# Patient Record
Sex: Female | Born: 1995 | Race: Black or African American | Hispanic: No | Marital: Single | Smoking: Never smoker
Health system: Southern US, Community
[De-identification: ages and names within clinical notes are randomized; demographics above are authoritative.]

## PROBLEM LIST (undated history)

## (undated) ENCOUNTER — Emergency Department (HOSPITAL_BASED_OUTPATIENT_CLINIC_OR_DEPARTMENT_OTHER): Admission: EM | Payer: Medicaid Other | Source: Home / Self Care

## (undated) DIAGNOSIS — Z789 Other specified health status: Secondary | ICD-10-CM

## (undated) DIAGNOSIS — J302 Other seasonal allergic rhinitis: Secondary | ICD-10-CM

## (undated) DIAGNOSIS — L709 Acne, unspecified: Secondary | ICD-10-CM

## (undated) HISTORY — DX: Acne, unspecified: L70.9

## (undated) HISTORY — PX: TONSILLECTOMY: SUR1361

---
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
## 2017-10-08 ENCOUNTER — Emergency Department (HOSPITAL_BASED_OUTPATIENT_CLINIC_OR_DEPARTMENT_OTHER)
Admission: EM | Admit: 2017-10-08 | Discharge: 2017-10-08 | Disposition: A | Discharge status: Home / Self Care | Payer: Managed Care, Other (non HMO) | Attending: Emergency Medicine | Admitting: Emergency Medicine

## 2017-10-08 ENCOUNTER — Other Ambulatory Visit: Payer: Self-pay

## 2017-10-08 ENCOUNTER — Encounter (HOSPITAL_BASED_OUTPATIENT_CLINIC_OR_DEPARTMENT_OTHER): Payer: Self-pay

## 2017-10-08 DIAGNOSIS — R040 Epistaxis: Secondary | ICD-10-CM | POA: Diagnosis not present

## 2017-10-08 DIAGNOSIS — Z91010 Allergy to peanuts: Secondary | ICD-10-CM | POA: Insufficient documentation

## 2017-10-08 DIAGNOSIS — J302 Other seasonal allergic rhinitis: Secondary | ICD-10-CM

## 2017-10-08 DIAGNOSIS — J301 Allergic rhinitis due to pollen: Secondary | ICD-10-CM | POA: Diagnosis not present

## 2017-10-08 DIAGNOSIS — Z79899 Other long term (current) drug therapy: Secondary | ICD-10-CM | POA: Diagnosis not present

## 2017-10-08 HISTORY — DX: Other seasonal allergic rhinitis: J30.2

## 2017-10-08 MED ORDER — OXYMETAZOLINE HCL 0.05 % NA SOLN
1.0000 | Freq: Once | NASAL | Status: AC
  Administered 2017-10-08: 1 via NASAL
  Filled 2017-10-08: qty 15

## 2017-10-08 MED ORDER — FLUTICASONE PROPIONATE 50 MCG/ACT NA SUSP: 2.0000 | Freq: Every day | NASAL | 0 refills | Status: DC

## 2017-10-08 MED ORDER — LEVOCETIRIZINE DIHYDROCHLORIDE 5 MG PO TABS: 5.0000 mg | ORAL_TABLET | Freq: Every evening | ORAL | 0 refills | Status: DC

## 2017-10-08 MED ORDER — ACETAMINOPHEN 325 MG PO TABS
650.0000 mg | ORAL_TABLET | Freq: Once | ORAL | Status: AC
  Administered 2017-10-08: 650 mg via ORAL
  Filled 2017-10-08: qty 2

## 2017-10-08 MED ORDER — CROMOLYN SODIUM 5.2 MG/ACT NA AERS: 1.0000 | INHALATION_SPRAY | Freq: Four times a day (QID) | NASAL | 12 refills | Status: DC

## 2017-10-08 NOTE — ED Notes (Signed)
Pt laying down for orthostatics 

## 2017-10-08 NOTE — Discharge Instructions (Addendum)
Nosebleeds commonly recur.  If your nose starts bleeding again and doesn't stop after 20 minutes of constant pressure squeezing the soft part of your nose, then spray 2 sprays of afrin in each side of the nose and apply direct pressure, holding for 20 min. then re-apply the direct pressure and return to the ED. Return also immediately to the nearest ED if you develop chest pain, shortness of breath, dizziness or fainting, severe bleeding, or other concerns.  Nasal Hygiene The nose has many positive effects on the air you breathe in that you may not be aware of. - Temperature regulation - Filtration and removal of particulate matter - Humidification - Defense against infections There are several things you can do to help keep your nose healthy. Foremost is nasal hygiene. This will help with your nose's natural function and keep it moist and healthy. Techniques to accomplish this are outlined below. These will help with nasal dryness, nasal crusting, and nose bleeds. They also assist with clearing thick mucus that cause you to blow your nose frequently and may be associated with thick postnasal drip.  1. Use nasal saline daily. You can buy small bottles of this over-the-counter at the drug store or grocery store. Some brand names are: [address], [employer] Mist, [place]. Apply 2-3 sprays each nostril several times a day. If your nose feels dry or have had recent nasal surgery, try to use it every couple of hours. There is no medicine in it so it can be used as often as you like. Do NOT use the sprays containing decongestants. The appropriate way to apply nasal sprays: Place the nozzle just inside your nostril and point it towards the corner of your eye. Often, it is helpful to use the right-hand to spray into the left nasal cavity and use the left-hand to spray into the right side.  2. Use nasal saline irrigations and flushing.SinuCleanse, Simply Saline, [place]. Use this 1-2 times a day. We can also  provide you with a recipe to use at home. Just ask us. To prevent reintroducing bacteria back into your nose, please keep your irrigation equipment clean and dry between uses. Throw away and replace reusable irrigation equipment every 3 weeks.   3. Use Vaseline petroleum jelly or Aquaphor. You can apply this gently to each nostril 2-3 times a day to promote moisturization for your nose. You may also use triple antibiotic ointment such as Neosporin or Bacitracin. These can all be bought over-the-counter.  4. Consider other nasal emollients. A few preparations are available over-thecounter. Ponaris, Nose Better, Pretz. Ask your pharmacist what is available. Also, some nasal saline sprays have additives such as aloe and these are helpful.  5. Consider using a humidifier at home. If your nose feels dry and/or you have frequent nose bleeds, you can buy a humidifier for your home. Be cautious in using these if you have mold allergies.  6. Avoid excessive manual manipulation of your nose and nostrils. Frequent rubbing of your nostrils and the passing of tissues or fingers in your nostrils may aggravate nasal irritation from dryness and nose bleeds.

## 2017-10-08 NOTE — ED Provider Notes (Signed)
[facility] EMERGENCY DEPARTMENT Provider Note   CSN: [id number] Arrival date & time: 10/08/17  1406     History   Chief Complaint Chief Complaint  Patient presents with  . Epistaxis    HPI Taylor Golden is a 22 y.o. female.  The history is provided by the patient (mother).  Epistaxis   This is a recurrent problem. The current episode started less than 1 hour ago. Episode frequency: during allergy season. The problem has been resolved. Associated with: nasal allergies. The bleeding has been from the left nare. She has tried applying pressure for the symptoms. The treatment provided no (Patient applied pressure to the bony part of the nose) relief. Her past medical history is significant for sinus problems and allergies. Her past medical history does not include bleeding disorder, colds, nose-picking or frequent nosebleeds.    Past Medical History:  Diagnosis Date  . Acne 2007  . Seasonal allergies     Patient Active Problem List   Diagnosis Date Noted  . Dysmenorrhea 02/13/2013  . Candidiasis of vagina 02/13/2013  . Bacterial vaginosis 02/13/2013  . Oral contraceptive prescribed 02/13/2013  . Exposure to potentially hazardous body fluids 02/13/2013    Past Surgical History:  Procedure Laterality Date  . FOOT SURGERY Right 2012   Cyst Removal  . TONSILECTOMY, ADENOIDECTOMY, BILATERAL MYRINGOTOMY AND TUBES N/A 2004     OB History   None      Home Medications    Prior to Admission medications   Medication Sig Start Date End Date Taking? Authorizing Provider  nitrofurantoin, macrocrystal-monohydrate, (MACROBID) 100 MG capsule Take 1 capsule (100 mg total) by mouth 2 (two) times daily. X 7 days 04/26/13   Cy BlamerPalumbo, April, MD    Family History Family History  Problem Relation Age of Onset  . Hypertension Mother   . Hypertension Maternal Grandmother     Social History Social History   Tobacco Use  . Smoking status: Never Smoker  . Smokeless  tobacco: Never Used  Substance Use Topics  . Alcohol use: Yes  . Drug use: Yes    Comment: THC vape pen     Allergies   Peanut oil   Review of Systems Review of Systems  HENT: Positive for congestion, nosebleeds, sinus pressure and sinus pain.   Respiratory: Negative for cough and choking.   Gastrointestinal: Negative for abdominal pain and nausea.  Neurological: Positive for headaches.     Physical Exam Updated Vital Signs BP (P) 126/83 (BP Location: Left Arm)   Pulse (P) 85   Temp (P) 98.4 F (36.9 C) (Oral)   Resp (P) 16   Ht 5\' 4"  (1.626 m)   Wt 52.2 kg (115 lb)   LMP 10/08/2017 (Exact Date)   SpO2 (P) 100%   BMI 19.74 kg/m   Physical Exam  Constitutional: She is oriented to person, place, and time. She appears well-developed and well-nourished. No distress.  HENT:  Head: Normocephalic and atraumatic.  Nose: Epistaxis is observed. Foreign body: small flecks of red blood in both nares.  No blood on th post oropharynx  Eyes: Conjunctivae are normal. No scleral icterus.  Neck: Normal range of motion.  Cardiovascular: Normal rate, regular rhythm and normal heart sounds. Exam reveals no gallop and no friction rub.  No murmur heard. Pulmonary/Chest: Effort normal and breath sounds normal. No respiratory distress.  Abdominal: Soft. Bowel sounds are normal. She exhibits no distension and no mass. There is no tenderness. There is no guarding.  Neurological:  She is alert and oriented to person, place, and time.  Skin: Skin is warm and dry. She is not diaphoretic.  Psychiatric: Her behavior is normal.  Nursing note and vitals reviewed.    ED Treatments / Results  Labs (all labs ordered are listed, but only abnormal results are displayed) Labs Reviewed - No data to display  EKG None  Radiology No results found.  Procedures Procedures (including critical care time)  Medications Ordered in ED Medications  oxymetazoline (AFRIN) 0.05 % nasal spray 1 spray  (has no administration in time range)  acetaminophen (TYLENOL) tablet 650 mg (has no administration in time range)     Initial Impression / Assessment and Plan / ED Course  I have reviewed the triage vital signs and the nursing notes.  Pertinent labs & imaging results that were available during my care of the patient were reviewed by me and considered in my medical decision making (see chart for details).      patient with sinus ha, recurrent epistaxis during allergy season. NB lasted about 20 minutes. No active bleeding. Discussed where to place pressure. patient does not take any allergy meds or preventives. Discussed nasal hygiene and return precautions.  Final Clinical Impressions(s) / ED Diagnoses   Final diagnoses:  Anterior epistaxis  Seasonal allergies    ED Discharge Orders    None       Arthor Captain, PA-C 10/08/17 Therisa Doyne, MD 10/08/17 [phone]

## 2017-10-08 NOTE — ED Triage Notes (Signed)
Pt arrived via [facility]. Reports that pt has had nosebleed 40 min PTA and pt thinks she lost approx 100 mL. Pt has hx of nosebleed. Per EMS pt was c/o HA, weakness, and nausea.

## 2019-01-24 ENCOUNTER — Other Ambulatory Visit: Payer: Self-pay

## 2019-01-24 ENCOUNTER — Encounter (HOSPITAL_COMMUNITY): Payer: Self-pay

## 2019-01-24 ENCOUNTER — Emergency Department (HOSPITAL_COMMUNITY)
Admission: EM | Admit: 2019-01-24 | Discharge: 2019-01-24 | Disposition: A | Discharge status: Home / Self Care | Payer: Managed Care, Other (non HMO) | Attending: Emergency Medicine | Admitting: Emergency Medicine

## 2019-01-24 DIAGNOSIS — R112 Nausea with vomiting, unspecified: Secondary | ICD-10-CM | POA: Insufficient documentation

## 2019-01-24 DIAGNOSIS — R109 Unspecified abdominal pain: Secondary | ICD-10-CM | POA: Diagnosis not present

## 2019-01-24 LAB — URINALYSIS, ROUTINE W REFLEX MICROSCOPIC
Bacteria, UA: NONE SEEN
Bilirubin Urine: NEGATIVE
Glucose, UA: NEGATIVE mg/dL
Ketones, ur: 5 mg/dL — AB
Leukocytes,Ua: NEGATIVE
Nitrite: NEGATIVE
Protein, ur: 100 mg/dL — AB
Specific Gravity, Urine: 1.031 — ABNORMAL HIGH (ref 1.005–1.030)
pH: 6 (ref 5.0–8.0)

## 2019-01-24 LAB — COMPREHENSIVE METABOLIC PANEL
ALT: 18 U/L (ref 0–44)
AST: 21 U/L (ref 15–41)
Albumin: 4.4 g/dL (ref 3.5–5.0)
Alkaline Phosphatase: 43 U/L (ref 38–126)
Anion gap: 11 (ref 5–15)
BUN: 8 mg/dL (ref 6–20)
CO2: 26 mmol/L (ref 22–32)
Calcium: 9.5 mg/dL (ref 8.9–10.3)
Chloride: 100 mmol/L (ref 98–111)
Creatinine, Ser: 0.8 mg/dL (ref 0.44–1.00)
GFR calc Af Amer: >60 mL/min (ref >60)
GFR calc non Af Amer: >60 mL/min (ref >60)
Glucose, Bld: 92 mg/dL (ref 70–99)
Potassium: 2.9 mmol/L — ABNORMAL LOW (ref 3.5–5.1)
Sodium: 137 mmol/L (ref 135–145)
Total Bilirubin: 0.7 mg/dL (ref 0.3–1.2)
Total Protein: 7.4 g/dL (ref 6.5–8.1)

## 2019-01-24 LAB — CBC
HCT: 34.6 % — ABNORMAL LOW (ref 36.0–46.0)
Hemoglobin: 10.8 g/dL — ABNORMAL LOW (ref 12.0–15.0)
MCH: 23.7 pg — ABNORMAL LOW (ref 26.0–34.0)
MCHC: 31.2 g/dL (ref 30.0–36.0)
MCV: 75.9 fL — ABNORMAL LOW (ref 80.0–100.0)
Platelets: 384 10*3/uL (ref 150–400)
RBC: 4.56 MIL/uL (ref 3.87–5.11)
RDW: 17.7 % — ABNORMAL HIGH (ref 11.5–15.5)
WBC: 6.3 10*3/uL (ref 4.0–10.5)
nRBC: 0 % (ref 0.0–0.2)

## 2019-01-24 LAB — LIPASE, BLOOD: Lipase: 29 U/L (ref 11–51)

## 2019-01-24 LAB — I-STAT BETA HCG BLOOD, ED (MC, WL, AP ONLY): I-stat hCG, quantitative: <5 m[IU]/mL (ref <5)

## 2019-01-24 MED ORDER — POTASSIUM CHLORIDE 10 MEQ/100ML IV SOLN
10.0000 meq | Freq: Once | INTRAVENOUS | Status: AC
Start: 2019-01-24 — End: 2019-01-24
  Administered 2019-01-24: 17:00:00 10 meq via INTRAVENOUS
  Filled 2019-01-24: qty 100

## 2019-01-24 MED ORDER — SODIUM CHLORIDE 0.9 % IV BOLUS
1000.0000 mL | Freq: Once | INTRAVENOUS | Status: AC
Start: 2019-01-24 — End: 2019-01-24
  Administered 2019-01-24: 1000 mL via INTRAVENOUS

## 2019-01-24 MED ORDER — ONDANSETRON HCL 4 MG/2ML IJ SOLN
4.0000 mg | Freq: Once | INTRAMUSCULAR | Status: AC
  Administered 2019-01-24: 4 mg via INTRAVENOUS
  Filled 2019-01-24: qty 2

## 2019-01-24 MED ORDER — POTASSIUM CHLORIDE CRYS ER 20 MEQ PO TBCR
20.0000 meq | EXTENDED_RELEASE_TABLET | Freq: Once | ORAL | Status: AC
  Administered 2019-01-24: 20 meq via ORAL
  Filled 2019-01-24: qty 1

## 2019-01-24 MED ORDER — SODIUM CHLORIDE 0.9% FLUSH: 3.0000 mL | Freq: Once | INTRAVENOUS | Status: DC

## 2019-01-24 MED ORDER — ONDANSETRON HCL 4 MG PO TABS: 4.0000 mg | ORAL_TABLET | Freq: Four times a day (QID) | ORAL | 0 refills | Status: DC

## 2019-01-24 NOTE — ED Provider Notes (Signed)
[facility] EMERGENCY DEPARTMENT Provider Note   CSN: [id number] Arrival date & time: 01/24/19  1209    History   Chief Complaint Chief Complaint  Patient presents with  . Abdominal Pain  . Emesis    HPI Taylor Golden is a 23 y.o. female.     HPI   23 year old female presents today with complaints of nausea and vomiting.  Patient notes proximate 8 months ago she started to develop vomiting after eating.  She notes that when she smokes marijuana she is able to keep food down.  When she does not smoke she has vomiting.  She notes this is food that has reached her stomach, she denies any blood.  She reports that since Thursday she has not been able to eat or drink.  She does note that prior to this episode she did drink alcohol on empty stomach.  She denies any significant pain at baseline, some minor pain with vomiting and eating.  No lower abdominal pain or fever.  She has been seen previously by her primary care provider but no specialty evaluation.   Past Medical History:  Diagnosis Date  . Acne 2007  . Seasonal allergies     Patient Active Problem List   Diagnosis Date Noted  . Dysmenorrhea 02/13/2013  . Candidiasis of vagina 02/13/2013  . Bacterial vaginosis 02/13/2013  . Oral contraceptive prescribed 02/13/2013  . Exposure to potentially hazardous body fluids 02/13/2013    Past Surgical History:  Procedure Laterality Date  . FOOT SURGERY Right 2012   Cyst Removal  . TONSILECTOMY, ADENOIDECTOMY, BILATERAL MYRINGOTOMY AND TUBES N/A 2004     OB History   No obstetric history on file.      Home Medications    Prior to Admission medications   Medication Sig Start Date End Date Taking? Authorizing Provider  ondansetron (ZOFRAN) 4 MG tablet Take 1 tablet (4 mg total) by mouth every 6 (six) hours. 01/24/19   Okey Regal, PA-C    Family History Family History  Problem Relation Age of Onset  . Hypertension Mother   . Hypertension  Maternal Grandmother     Social History Social History   Tobacco Use  . Smoking status: Never Smoker  . Smokeless tobacco: Never Used  Substance Use Topics  . Alcohol use: Yes  . Drug use: Yes    Comment: THC vape pen     Allergies   Peanut oil   Review of Systems Review of Systems  All other systems reviewed and are negative.   Physical Exam Updated Vital Signs BP 132/83 (BP Location: Left Arm)   Pulse 79   Temp 98.1 F (36.7 C) (Oral)   Resp 14   SpO2 100%   Physical Exam Vitals signs and nursing note reviewed.  Constitutional:      Appearance: She is well-developed.  HENT:     Head: Normocephalic and atraumatic.  Eyes:     General: No scleral icterus.       Right eye: No discharge.        Left eye: No discharge.     Conjunctiva/sclera: Conjunctivae normal.     Pupils: Pupils are equal, round, and reactive to light.  Neck:     Musculoskeletal: Normal range of motion.     Vascular: No JVD.     Trachea: No tracheal deviation.  Pulmonary:     Effort: Pulmonary effort is normal.     Breath sounds: No stridor.  Abdominal:  General: Abdomen is flat. There is no distension.     Palpations: Abdomen is soft. There is no mass.     Tenderness: There is no abdominal tenderness. There is no guarding or rebound.     Hernia: No hernia is present.  Neurological:     Mental Status: She is alert and oriented to person, place, and time.     Coordination: Coordination normal.  Psychiatric:        Behavior: Behavior normal.        Thought Content: Thought content normal.        Judgment: Judgment normal.      ED Treatments / Results  Labs (all labs ordered are listed, but only abnormal results are displayed) Labs Reviewed  COMPREHENSIVE METABOLIC PANEL - Abnormal; Notable for the following components:      Result Value   Potassium 2.9 (*)    All other components within normal limits  CBC - Abnormal; Notable for the following components:   Hemoglobin 10.8  (*)    HCT 34.6 (*)    MCV 75.9 (*)    MCH 23.7 (*)    RDW 17.7 (*)    All other components within normal limits  URINALYSIS, ROUTINE W REFLEX MICROSCOPIC - Abnormal; Notable for the following components:   APPearance HAZY (*)    Specific Gravity, Urine 1.031 (*)    Hgb urine dipstick LARGE (*)    Ketones, ur 5 (*)    Protein, ur 100 (*)    All other components within normal limits  LIPASE, BLOOD  I-STAT BETA HCG BLOOD, ED ([facility], [facility], [facility] ONLY)    EKG None  Radiology No results found.  Procedures Procedures (including critical care time)  Medications Ordered in ED Medications  sodium chloride flush (NS) 0.9 % injection 3 mL (has no administration in time range)  potassium chloride 10 mEq in 100 mL IVPB (0 mEq Intravenous Stopped 01/24/19 1819)  sodium chloride 0.9 % bolus 1,000 mL (0 mLs Intravenous Stopped 01/24/19 1851)  ondansetron (ZOFRAN) injection 4 mg (4 mg Intravenous Given 01/24/19 1718)  potassium chloride SA (K-DUR) CR tablet 20 mEq (20 mEq Oral Given 01/24/19 1909)     Initial Impression / Assessment and Plan / ED Course  I have reviewed the triage vital signs and the nursing notes.  Pertinent labs & imaging results that were available during my care of the patient were reviewed by me and considered in my medical decision making (see chart for details).        23 year old female presents today with vomiting.  This is chronic in nature.  She has no abdominal pain her labs are reassuring other than minor hypokalemia.  No signs of infectious etiology, low suspicion for gallbladder pathology.  Patient given run of IV potassium, oral potassium, tolerating p.o.  She will be discharged with outpatient gastroenterology follow-up and strict return precautions.  She verbalized understanding and agreement to today's plan and had no further questions or concerns.  Final Clinical Impressions(s) / ED Diagnoses   Final diagnoses:  Non-intractable vomiting with nausea,  unspecified vomiting type    ED Discharge Orders         Ordered    ondansetron (ZOFRAN) 4 MG tablet  Every 6 hours     01/24/19 1937           Rosalio LoudHedges, Jalexa Pifer, PA-C 01/24/19 2008    Terrilee FilesButler, Michael C, MD 01/25/19 0900

## 2019-01-24 NOTE — Discharge Instructions (Signed)
Please read attached information. If you experience any new or worsening signs or symptoms please return to the emergency room for evaluation. Please follow-up with your primary care provider or specialist as discussed. Please use medication prescribed only as directed and discontinue taking if you have any concerning signs or symptoms.   °

## 2019-01-24 NOTE — ED Triage Notes (Signed)
Pt reports abd pain and emesis since Friday. These symptoms only occur when she tries to eat. Pt was seen at [facility] and given a shot and rx for zofran. Pt reports decreased urinary output, not able to eat or drink much. Pt a.o, no pain at this time.

## 2019-02-22 ENCOUNTER — Other Ambulatory Visit: Payer: Self-pay

## 2019-02-22 ENCOUNTER — Encounter: Payer: Self-pay | Admitting: Internal Medicine

## 2019-02-22 ENCOUNTER — Ambulatory Visit (INDEPENDENT_AMBULATORY_CARE_PROVIDER_SITE_OTHER): Payer: Managed Care, Other (non HMO) | Admitting: Internal Medicine

## 2019-02-22 VITALS — BP 118/72 | HR 92 | Temp 99.0°F | Ht 63.0 in | Wt 110.6 lb

## 2019-02-22 DIAGNOSIS — R7989 Other specified abnormal findings of blood chemistry: Secondary | ICD-10-CM | POA: Diagnosis not present

## 2019-02-22 NOTE — Progress Notes (Signed)
Name: Taylor Golden  MRN/ DOB: [id number], [birth year]    Age/ Sex: 23 y.o., female    PCP: Berkley Harvey, NP   Reason for Endocrinology Evaluation: Low TSH      Date of Initial Endocrinology Evaluation: 02/22/2019     HPI: [name] is a 23 y.o. female with a past medical history of anemia The patient presented for initial endocrinology clinic visit on 02/22/2019 for consultative assistance with her low TSH .   Pt was noted to have low TSH on routine blood work.   She has noted unexplained weight loss. Denies palpitations, diarrhea. Has cold intolerance.   Denies any local neck symptoms.   Has regular menstruation   She is on biotin    No FH of thyroid disease     HISTORY:  Past Medical History:  Past Medical History:  Diagnosis Date  . Acne 2007  . Seasonal allergies    Past Surgical History:  Past Surgical History:  Procedure Laterality Date  . FOOT SURGERY Right 2012   Cyst Removal  . TONSILECTOMY, ADENOIDECTOMY, BILATERAL MYRINGOTOMY AND TUBES N/A 2004      Social History:  reports that she has never smoked. She has never used smokeless tobacco. She reports current alcohol use. She reports current drug use.  Family History: family history includes Hypertension in her maternal grandmother and mother.   HOME MEDICATIONS: Allergies as of 02/22/2019      Reactions   Peanut Oil Swelling      Medication List       Accurate as of February 22, 2019  4:19 PM. If you have any questions, ask your nurse or doctor.        Nu-Iron 150 MG capsule Generic drug: iron polysaccharides Take by mouth.   ondansetron 4 MG tablet Commonly known as: ZOFRAN Take 1 tablet (4 mg total) by mouth every 6 (six) hours.         REVIEW OF SYSTEMS: A comprehensive ROS was conducted with the patient and is negative except as per HPI and below:  Review of Systems  Eyes: Negative for blurred vision, pain and discharge.  Respiratory: Negative for cough and  shortness of breath.   Cardiovascular: Negative for chest pain and palpitations.  Gastrointestinal: Negative for diarrhea and nausea.  Genitourinary: Negative for frequency.  Neurological: Negative for tingling and tremors.  Endo/Heme/Allergies: Negative for polydipsia.  Psychiatric/Behavioral: Negative for suicidal ideas. The patient is nervous/anxious.        Chronic        OBJECTIVE:  VS: BP 118/72 (BP Location: Left Arm, Patient Position: Sitting, Cuff Size: Normal)   Pulse 92   Temp 99 F (37.2 C)   Ht 5\' 3"  (1.6 m)   Wt 110 lb 9.6 oz (50.2 kg)   SpO2 97%   BMI 19.59 kg/m    Wt Readings from Last 3 Encounters:  02/22/19 110 lb 9.6 oz (50.2 kg)  10/08/17 115 lb (52.2 kg)  04/26/13 121 lb (54.9 kg) (42 %, Z= -0.20)*   * Growth percentiles are based on CDC (Girls, 2-20 Years) data.     EXAM: General: Pt appears well and is in NAD  Hydration: Well-hydrated with moist mucous membranes and good skin turgor  Eyes: External eye exam normal without stare, lid lag or exophthalmos.  EOM intact.   Ears, Nose, Throat: Hearing: Grossly intact bilaterally Dental: Good dentition  Throat: Clear without mass, erythema or exudate  Neck: General: Supple without adenopathy. Thyroid:  Thyroid size normal.  No goiter or nodules appreciated. No thyroid bruit.  Lungs: Clear with good BS bilat with no rales, rhonchi, or wheezes  Heart: Auscultation: RRR.  Abdomen: Normoactive bowel sounds, soft, nontender, without masses or organomegaly palpable  Extremities:  BL LE: No pretibial edema normal ROM and strength.  Skin: Hair: Texture and amount normal with gender appropriate distribution Skin Inspection: No rashes, acanthosis nigricans/skin tags. No lipohypertrophy Skin Palpation: Skin temperature, texture, and thickness normal to palpation  Neuro: Cranial nerves: II - XII grossly intact  Motor: Normal strength throughout DTRs: 2+ and symmetric in UE without delay in relaxation phase   Mental Status: Judgment, insight: Intact Orientation: Oriented to time, place, and person Mood and affect: No depression, anxiety, or agitation     DATA REVIEWED: 10/25/2018 TSH 0.390 uIU/mL  T4 8 mcg/dL (1.6-105.5-11.8)   01/31/2019  TSH 0.398 uIU/mL  T4 7.8     ASSESSMENT/PLAN/RECOMMENDATIONS:   1. Low TSH    - We discussed D/D of  Subclinical hyperthyroidism, such as Graves' Disease, autonomously functioning thyroid adenomas and multinodular goiters ,lab interference (Biotin intake ) . There has also been reports of a  small entity of people tend to have a low TSH level that is normal for them.   - Most patients with subclinical hyperthyroidism have no clinical manifestations of hyperthyroidism, and those symptoms that are present (eg, tachycardia, tremor, dyspnea on exertion, weight loss) are mild and nonspecific.  - Clinically she is euthyoid - She was advised to hold the biotin for 2 days prior to lab checks   Pt will return for labs    F/u in 3 months     Signed electronically by: Lyndle HerrlichAbby Jaralla Lorma Heater, MD  [facility]  [facility] [address] [place], [place] [address] Phone: [phone] FAX: [phone]   CC: Iona HansenJones, Penny L, NP [address] [place] [address] Phone: [phone] Fax: [phone]   Return to Endocrinology clinic as below: No future appointments.

## 2019-02-22 NOTE — Patient Instructions (Addendum)
-   Stop by the lab after holding  The "hair,skin,nail" vitamin for 2-3 days before labs

## 2019-02-23 ENCOUNTER — Encounter: Payer: Self-pay | Admitting: Internal Medicine

## 2019-03-03 ENCOUNTER — Other Ambulatory Visit: Payer: Managed Care, Other (non HMO)

## 2019-03-04 ENCOUNTER — Other Ambulatory Visit (INDEPENDENT_AMBULATORY_CARE_PROVIDER_SITE_OTHER): Payer: Managed Care, Other (non HMO)

## 2019-03-04 ENCOUNTER — Other Ambulatory Visit: Payer: Self-pay

## 2019-03-04 DIAGNOSIS — R7989 Other specified abnormal findings of blood chemistry: Secondary | ICD-10-CM | POA: Diagnosis not present

## 2019-03-04 NOTE — Addendum Note (Signed)
Addended by: Kaylyn Lim I on: 03/04/2019 04:41 PM   Modules accepted: Orders

## 2019-03-04 NOTE — Addendum Note (Signed)
Addended by: STONE-ELMORE, Pluma Diniz I on: 03/04/2019 04:41 PM   Modules accepted: Orders  

## 2019-03-08 ENCOUNTER — Telehealth: Payer: Self-pay | Admitting: Internal Medicine

## 2019-03-08 LAB — TSH: TSH: 0.23 mIU/L — ABNORMAL LOW

## 2019-03-08 LAB — T3: T3, Total: 105 ng/dL (ref 76–181)

## 2019-03-08 LAB — T4, FREE: Free T4: 1.4 ng/dL (ref 0.8–1.8)

## 2019-03-08 LAB — TRAB (TSH RECEPTOR BINDING ANTIBODY): TRAB: 1.76 IU/L (ref <=2.00)

## 2019-03-08 NOTE — Telephone Encounter (Signed)
Helped pt reset Mychart password for future communication

## 2019-03-08 NOTE — Telephone Encounter (Signed)
Have all results came back for this pt?

## 2019-03-08 NOTE — Telephone Encounter (Signed)
Patient is calling to see if her lab results have come back   Requesting a call back

## 2019-03-11 NOTE — Telephone Encounter (Signed)
Pt informed of results.

## 2019-03-11 NOTE — Telephone Encounter (Signed)
Patient is calling to see if we have received the rest of her lab results.  Please Advise, Thanks

## 2019-04-01 ENCOUNTER — Encounter (HOSPITAL_COMMUNITY): Payer: Self-pay | Admitting: Emergency Medicine

## 2019-04-01 ENCOUNTER — Other Ambulatory Visit: Payer: Self-pay

## 2019-04-01 ENCOUNTER — Ambulatory Visit (HOSPITAL_COMMUNITY)
Admission: EM | Admit: 2019-04-01 | Discharge: 2019-04-01 | Disposition: A | Discharge status: Home / Self Care | Payer: Managed Care, Other (non HMO) | Attending: Emergency Medicine | Admitting: Emergency Medicine

## 2019-04-01 DIAGNOSIS — Z20828 Contact with and (suspected) exposure to other viral communicable diseases: Secondary | ICD-10-CM | POA: Insufficient documentation

## 2019-04-01 DIAGNOSIS — R6889 Other general symptoms and signs: Secondary | ICD-10-CM | POA: Diagnosis present

## 2019-04-01 DIAGNOSIS — Z20822 Contact with and (suspected) exposure to covid-19: Secondary | ICD-10-CM

## 2019-04-01 DIAGNOSIS — R509 Fever, unspecified: Secondary | ICD-10-CM | POA: Diagnosis not present

## 2019-04-01 DIAGNOSIS — R52 Pain, unspecified: Secondary | ICD-10-CM | POA: Insufficient documentation

## 2019-04-01 NOTE — ED Provider Notes (Signed)
[facility]    CSN: [id number] Arrival date & time: 04/01/19  1848      History   Chief Complaint Chief Complaint  Patient presents with  . Fever    HPI Taylor Golden is a 23 y.o. female.   [name] presents with complaints of body aches, back ache. Headache, behind eyes. Chills, sweats. Chest pain. Started two nights ago. Today no worse but no better. No cough, no sore throat no runny nose. Dizzy if first get up after laying for a long time. Eating and drinking well. No nausea vomiting or diarrhea. Abdominal pain, consistent with PMS, it comes and goes. No rash. Right ear pain. Has been taking tylenol 500mg , last at noon. Unsure if it helps. Best friend who lives with patient has a boyfriend in [place] who was diagnosed with covid 19. He tested positive last week, friend was with him, and has since been back around patient since then. The friend does not have any symptoms. Tashena works from home without any other known ill contacts. History  Of allergies, BV, acne.     ROS per HPI, negative if not otherwise mentioned.      Past Medical History:  Diagnosis Date  . Acne 2007  . Seasonal allergies     Patient Active Problem List   Diagnosis Date Noted  . Dysmenorrhea 02/13/2013  . Candidiasis of vagina 02/13/2013  . Bacterial vaginosis 02/13/2013  . Oral contraceptive prescribed 02/13/2013  . Exposure to potentially hazardous body fluids 02/13/2013    Past Surgical History:  Procedure Laterality Date  . FOOT SURGERY Right 2012   Cyst Removal  . TONSILECTOMY, ADENOIDECTOMY, BILATERAL MYRINGOTOMY AND TUBES N/A 2004    OB History   No obstetric history on file.      Home Medications    Prior to Admission medications   Medication Sig Start Date End Date Taking? Authorizing Provider  iron polysaccharides (NU-IRON) 150 MG capsule Take by mouth. 02/07/19  Yes [provider]  ondansetron (ZOFRAN) 4 MG tablet Take 1 tablet (4 mg  total) by mouth every 6 (six) hours. 01/24/19   Okey Regal, PA-C    Family History Family History  Problem Relation Age of Onset  . Hypertension Mother   . Hypertension Maternal Grandmother     Social History Social History   Tobacco Use  . Smoking status: Never Smoker  . Smokeless tobacco: Never Used  Substance Use Topics  . Alcohol use: Yes  . Drug use: Yes    Comment: THC vape pen     Allergies   Peanut oil   Review of Systems Review of Systems   Physical Exam Triage Vital Signs ED Triage Vitals  Enc Vitals Group     BP 04/01/19 1945 126/77     Pulse Rate 04/01/19 1945 (!) 103     Resp 04/01/19 1945 16     Temp 04/01/19 1945 100.1 F (37.8 C)     Temp Source 04/01/19 1945 Oral     SpO2 04/01/19 1945 97 %     Weight --      Height --      Head Circumference --      Peak Flow --      Pain Score 04/01/19 1944 4     Pain Loc --      Pain Edu? --      Excl. in [place]? --    No data found.  Updated Vital Signs BP 126/77  Pulse (!) 103   Temp 100.1 F (37.8 C) (Oral)   Resp 16   LMP 03/11/2019   SpO2 97%    Physical Exam Constitutional:      General: She is not in acute distress.    Appearance: She is well-developed.  Cardiovascular:     Rate and Rhythm: Normal rate.  Pulmonary:     Effort: Pulmonary effort is normal.  Skin:    General: Skin is warm and dry.  Neurological:     Mental Status: She is alert and oriented to person, place, and time.      UC Treatments / Results  Labs (all labs ordered are listed, but only abnormal results are displayed) Labs Reviewed  NOVEL CORONAVIRUS, NAA (HOSP ORDER, SEND-OUT TO REF LAB; TAT 18-24 HRS)    EKG   Radiology No results found.  Procedures Procedures (including critical care time)  Medications Ordered in UC Medications - No data to display  Initial Impression / Assessment and Plan / UC Course  I have reviewed the triage vital signs and the nursing notes.  Pertinent labs &  imaging results that were available during my care of the patient were reviewed by me and considered in my medical decision making (see chart for details).     Mild tachycardia and low grade temp. Non toxic. No increased work of breathing. covid exposure, concern for covid-19. Encourage supportive cares and isolation. Return precautions provided. Patient verbalized understanding and agreeable to plan.   Final Clinical Impressions(s) / UC Diagnoses   Final diagnoses:  Fever, unspecified fever cause  Body aches  Suspected Covid-19 Virus Infection  Exposure to Covid-19 Virus     Discharge Instructions     Push fluids to ensure adequate hydration and keep secretions thin.  Tylenol and/or ibuprofen as needed for pain or fevers.  May take 1000mg  of tylenol every 8 hours. May alternate with ibuprofen, up to 800mg  every 8 hours.  Rest.  Self isolate until covid results are back and negative.  Will notify you of any positive findings. You may monitor your results on your MyChart online as well.    Will need to isolate no matter until have been fever free for at least 3 days without medications.  If symptoms worsen or do not improve in the next 3 weeks to return to be seen or to follow up with your PCP.      ED Prescriptions    None     PDMP not reviewed this encounter.   , NP 04/01/19 2036

## 2019-04-01 NOTE — Discharge Instructions (Signed)
Push fluids to ensure adequate hydration and keep secretions thin.  Tylenol and/or ibuprofen as needed for pain or fevers.  May take 1000mg  of tylenol every 8 hours. May alternate with ibuprofen, up to 800mg  every 8 hours.  Rest.  Self isolate until covid results are back and negative.  Will notify you of any positive findings. You may monitor your results on your MyChart online as well.    Will need to isolate no matter until have been fever free for at least 3 days without medications.  If symptoms worsen or do not improve in the next 3 weeks to return to be seen or to follow up with your PCP.

## 2019-04-01 NOTE — ED Triage Notes (Signed)
Fever, shills, bodyaches started last night. Took tylenol at noon

## 2019-04-03 LAB — NOVEL CORONAVIRUS, NAA (HOSP ORDER, SEND-OUT TO REF LAB; TAT 18-24 HRS): SARS-CoV-2, NAA: NOT DETECTED

## 2019-04-04 ENCOUNTER — Encounter (HOSPITAL_COMMUNITY): Payer: Self-pay

## 2019-05-23 ENCOUNTER — Other Ambulatory Visit: Payer: Self-pay

## 2019-05-25 ENCOUNTER — Encounter: Payer: Managed Care, Other (non HMO) | Admitting: Internal Medicine

## 2019-05-25 NOTE — Progress Notes (Deleted)
Name: Taylor Golden  MRN/ DOB: [id number], June 12, 1996    Age/ Sex: 23 y.o., female     PCP: Iona Hansen, NP   Reason for Endocrinology Evaluation: Subclinical hyperthyroidism     Initial Endocrinology Clinic Visit: 02/22/2019    PATIENT IDENTIFIER: [name] is a 23 y.o., female with unremarkable  past medical history. She has followed with [facility] since 02/22/2019 for consultative assistance with management of her subclinical hyperthyroidism   HISTORICAL SUMMARY: The patient was first noted to have a low TSH with normal FT4 during routine workup in 10/2018. She did notice some weight loss at the time but no other symptoms but no other symptoms.    SUBJECTIVE:   During last visit (02/22/2019): TSH 0.23 uIU/mL with normal FT4. No treatment offered.   Today (05/25/2019):  [name] is here for 3 month follow up on subclinical hyperthyroidism .    ROS:  As per HPI.   HISTORY:  Past Medical History:  Past Medical History:  Diagnosis Date  . Acne 2007  . Seasonal allergies     Past Surgical History:  Past Surgical History:  Procedure Laterality Date  . FOOT SURGERY Right 2012   Cyst Removal  . TONSILECTOMY, ADENOIDECTOMY, BILATERAL MYRINGOTOMY AND TUBES N/A 2004     Social History:  reports that she has never smoked. She has never used smokeless tobacco. She reports current alcohol use. She reports current drug use. Family History:  Family History  Problem Relation Age of Onset  . Hypertension Mother   . Hypertension Maternal Grandmother       HOME MEDICATIONS: Allergies as of 05/25/2019      Reactions   Peanut Oil Swelling      Medication List       Accurate as of May 25, 2019 11:45 AM. If you have any questions, ask your nurse or doctor.        Nu-Iron 150 MG capsule Generic drug: iron polysaccharides Take by mouth.   ondansetron 4 MG tablet Commonly known as: ZOFRAN Take 1 tablet (4 mg total) by mouth  every 6 (six) hours.         OBJECTIVE:   PHYSICAL EXAM: VS: There were no vitals taken for this visit.   EXAM: General: Pt appears well and is in NAD  Hydration: Well-hydrated with moist mucous membranes and good skin turgor  Eyes: External eye exam normal without stare, lid lag or exophthalmos.  EOM intact.  PERRL.  Ears, Nose, Throat: Hearing: Grossly intact bilaterally Dental: Good dentition  Throat: Clear without mass, erythema or exudate  Neck: General: Supple without adenopathy. Thyroid: Thyroid size normal.  No goiter or nodules appreciated. No thyroid bruit.  Lungs: Clear with good BS bilat with no rales, rhonchi, or wheezes  Heart: Auscultation: RRR.  Abdomen: Normoactive bowel sounds, soft, nontender, without masses or organomegaly palpable  Extremities: Gait and station: Normal gait  Digits and nails: No clubbing, cyanosis, petechiae, or nodes Head and neck: Normal alignment and mobility BL UE: Normal ROM and strength. BL LE: No pretibial edema normal ROM and strength.  Skin: Hair: Texture and amount normal with gender appropriate distribution Skin Inspection: No rashes, acanthosis nigricans/skin tags. No lipohypertrophy Skin Palpation: Skin temperature, texture, and thickness normal to palpation  Neuro: Cranial nerves: II - XII grossly intact  Cerebellar: Normal coordination and movement; no tremor Motor: Normal strength throughout DTRs: 2+ and symmetric in UE without delay in relaxation phase  Mental Status: Judgment, insight:  Intact Orientation: Oriented to time, place, and person Memory: Intact for recent and remote events Mood and affect: No depression, anxiety, or agitation     DATA REVIEWED: ***    ASSESSMENT / PLAN / RECOMMENDATIONS:   1. Subclinical Hyperthyroidism:    -     Medications   ***   Signed electronically by: Mack Guise, MD  [facility]  [facility] [address] [place]  [place],  [address] Phone: [phone] FAX: [phone]      CC: Berkley Harvey, NP [place] [address] Phone: [phone]  Fax: [phone]   Return to Endocrinology clinic as below: Future Appointments  Date Time Provider [place]  05/25/2019  3:40 PM Shamleffer, Melanie Crazier, MD [facility] None

## 2019-06-13 ENCOUNTER — Emergency Department (HOSPITAL_COMMUNITY): Payer: Managed Care, Other (non HMO)

## 2019-06-13 ENCOUNTER — Encounter (HOSPITAL_COMMUNITY): Payer: Self-pay | Admitting: Emergency Medicine

## 2019-06-13 ENCOUNTER — Emergency Department (HOSPITAL_COMMUNITY)
Admission: EM | Admit: 2019-06-13 | Discharge: 2019-06-13 | Disposition: A | Discharge status: Home / Self Care | Payer: Managed Care, Other (non HMO) | Attending: Emergency Medicine | Admitting: Emergency Medicine

## 2019-06-13 ENCOUNTER — Other Ambulatory Visit: Payer: Self-pay

## 2019-06-13 DIAGNOSIS — R112 Nausea with vomiting, unspecified: Secondary | ICD-10-CM | POA: Insufficient documentation

## 2019-06-13 DIAGNOSIS — Z79899 Other long term (current) drug therapy: Secondary | ICD-10-CM | POA: Diagnosis not present

## 2019-06-13 DIAGNOSIS — R197 Diarrhea, unspecified: Secondary | ICD-10-CM | POA: Insufficient documentation

## 2019-06-13 DIAGNOSIS — R1013 Epigastric pain: Secondary | ICD-10-CM | POA: Diagnosis not present

## 2019-06-13 DIAGNOSIS — R000 Tachycardia, unspecified: Secondary | ICD-10-CM | POA: Insufficient documentation

## 2019-06-13 DIAGNOSIS — Z20828 Contact with and (suspected) exposure to other viral communicable diseases: Secondary | ICD-10-CM | POA: Diagnosis not present

## 2019-06-13 DIAGNOSIS — F1290 Cannabis use, unspecified, uncomplicated: Secondary | ICD-10-CM | POA: Insufficient documentation

## 2019-06-13 LAB — COMPREHENSIVE METABOLIC PANEL
ALT: 18 U/L (ref 0–44)
AST: 23 U/L (ref 15–41)
Albumin: 4.8 g/dL (ref 3.5–5.0)
Alkaline Phosphatase: 50 U/L (ref 38–126)
Anion gap: 13 (ref 5–15)
BUN: 14 mg/dL (ref 6–20)
CO2: 25 mmol/L (ref 22–32)
Calcium: 10 mg/dL (ref 8.9–10.3)
Chloride: 100 mmol/L (ref 98–111)
Creatinine, Ser: 0.82 mg/dL (ref 0.44–1.00)
GFR calc Af Amer: >60 mL/min (ref >60)
GFR calc non Af Amer: >60 mL/min (ref >60)
Glucose, Bld: 103 mg/dL — ABNORMAL HIGH (ref 70–99)
Potassium: 3.8 mmol/L (ref 3.5–5.1)
Sodium: 138 mmol/L (ref 135–145)
Total Bilirubin: 0.9 mg/dL (ref 0.3–1.2)
Total Protein: 8.5 g/dL — ABNORMAL HIGH (ref 6.5–8.1)

## 2019-06-13 LAB — CBC
HCT: 37.2 % (ref 36.0–46.0)
Hemoglobin: 11.7 g/dL — ABNORMAL LOW (ref 12.0–15.0)
MCH: 22.9 pg — ABNORMAL LOW (ref 26.0–34.0)
MCHC: 31.5 g/dL (ref 30.0–36.0)
MCV: 72.9 fL — ABNORMAL LOW (ref 80.0–100.0)
Platelets: 322 10*3/uL (ref 150–400)
RBC: 5.1 MIL/uL (ref 3.87–5.11)
RDW: 18.6 % — ABNORMAL HIGH (ref 11.5–15.5)
WBC: 7.5 10*3/uL (ref 4.0–10.5)
nRBC: 0 % (ref 0.0–0.2)

## 2019-06-13 LAB — URINALYSIS, ROUTINE W REFLEX MICROSCOPIC
Bilirubin Urine: NEGATIVE
Glucose, UA: NEGATIVE mg/dL
Hgb urine dipstick: NEGATIVE
Ketones, ur: 80 mg/dL — AB
Leukocytes,Ua: NEGATIVE
Nitrite: NEGATIVE
Protein, ur: 100 mg/dL — AB
Specific Gravity, Urine: 1.035 — ABNORMAL HIGH (ref 1.005–1.030)
pH: 6 (ref 5.0–8.0)

## 2019-06-13 LAB — LIPASE, BLOOD: Lipase: 24 U/L (ref 11–51)

## 2019-06-13 LAB — I-STAT BETA HCG BLOOD, ED (MC, WL, AP ONLY): I-stat hCG, quantitative: <5 m[IU]/mL (ref <5)

## 2019-06-13 LAB — SARS CORONAVIRUS 2 (TAT 6-24 HRS): SARS Coronavirus 2: NEGATIVE

## 2019-06-13 MED ORDER — IOHEXOL 300 MG/ML  SOLN
100.0000 mL | Freq: Once | INTRAMUSCULAR | Status: AC | PRN
  Administered 2019-06-13: 100 mL via INTRAVENOUS

## 2019-06-13 MED ORDER — PROMETHAZINE HCL 25 MG/ML IJ SOLN
12.5000 mg | Freq: Once | INTRAMUSCULAR | Status: AC
  Administered 2019-06-13: 12.5 mg via INTRAVENOUS
  Filled 2019-06-13: qty 1

## 2019-06-13 MED ORDER — SODIUM CHLORIDE 0.9 % IV BOLUS
1000.0000 mL | Freq: Once | INTRAVENOUS | Status: AC
  Administered 2019-06-13: 15:00:00 1000 mL via INTRAVENOUS

## 2019-06-13 MED ORDER — SODIUM CHLORIDE 0.9 % IV BOLUS
1000.0000 mL | Freq: Once | INTRAVENOUS | Status: AC
  Administered 2019-06-13: 10:00:00 1000 mL via INTRAVENOUS

## 2019-06-13 MED ORDER — SODIUM CHLORIDE 0.9% FLUSH
3.0000 mL | Freq: Once | INTRAVENOUS | Status: AC
  Administered 2019-06-13: 3 mL via INTRAVENOUS

## 2019-06-13 MED ORDER — ONDANSETRON HCL 4 MG/2ML IJ SOLN
4.0000 mg | Freq: Once | INTRAMUSCULAR | Status: AC
  Administered 2019-06-13: 4 mg via INTRAVENOUS
  Filled 2019-06-13: qty 2

## 2019-06-13 MED ORDER — ONDANSETRON 4 MG PO TBDP: 4.0000 mg | ORAL_TABLET | Freq: Three times a day (TID) | ORAL | 0 refills | Status: DC | PRN

## 2019-06-13 MED ORDER — MORPHINE SULFATE (PF) 4 MG/ML IV SOLN
4.0000 mg | Freq: Once | INTRAVENOUS | Status: AC
  Administered 2019-06-13: 4 mg via INTRAVENOUS
  Filled 2019-06-13: qty 1

## 2019-06-13 NOTE — ED Notes (Signed)
Transported to CT 

## 2019-06-13 NOTE — ED Notes (Signed)
Mother arrived at bedside.

## 2019-06-13 NOTE — Discharge Instructions (Signed)
Avoid marijuana ?

## 2019-06-13 NOTE — ED Notes (Signed)
Patient in room

## 2019-06-13 NOTE — ED Triage Notes (Signed)
Pt reports vomiting, diarrhea and body aches since Friday night, denies fever or chills, denies cough or sob, no recent sick contacts.

## 2019-06-13 NOTE — ED Notes (Signed)
Patient not in room

## 2019-06-13 NOTE — ED Notes (Signed)
Returned from CT.

## 2019-06-13 NOTE — ED Provider Notes (Signed)
[facility] Provider Note   CSN: [id number] Arrival date & time: 06/13/19  [id number]     History   Chief Complaint Chief Complaint  Patient presents with   Vomiting   Diarrhea    HPI Taylor Golden is a 23 y.o. female.     Pt presents to the ED today with n/v/d.  The pt said sx started on 12/4.  She has mainly had vomiting, just a little diarrhea.  No known sick contacts.       Past Medical History:  Diagnosis Date   Acne 2007   Seasonal allergies     Patient Active Problem List   Diagnosis Date Noted   Dysmenorrhea 02/13/2013   Candidiasis of vagina 02/13/2013   Bacterial vaginosis 02/13/2013   Oral contraceptive prescribed 02/13/2013   Exposure to potentially hazardous body fluids 02/13/2013    Past Surgical History:  Procedure Laterality Date   FOOT SURGERY Right 2012   Cyst Removal   TONSILECTOMY, ADENOIDECTOMY, BILATERAL MYRINGOTOMY AND TUBES N/A 2004     OB History   No obstetric history on file.      Home Medications    Prior to Admission medications   Medication Sig Start Date End Date Taking? Authorizing Provider  iron polysaccharides (NU-IRON) 150 MG capsule Take by mouth. 02/07/19   [provider]  ondansetron (ZOFRAN ODT) 4 MG disintegrating tablet Take 1 tablet (4 mg total) by mouth every 8 (eight) hours as needed. 06/13/19   Jacalyn LefevreHaviland, Jeena Arnett, MD  ondansetron (ZOFRAN) 4 MG tablet Take 1 tablet (4 mg total) by mouth every 6 (six) hours. 01/24/19   Eyvonne MechanicHedges, Jeffrey, PA-C    Family History Family History  Problem Relation Age of Onset   Hypertension Mother    Hypertension Maternal Grandmother     Social History Social History   Tobacco Use   Smoking status: Never Smoker   Smokeless tobacco: Never Used  Substance Use Topics   Alcohol use: Yes   Drug use: Yes    Comment: THC vape pen     Allergies   Peanut oil   Review of Systems Review of Systems    Gastrointestinal: Positive for abdominal pain, diarrhea, nausea and vomiting.  All other systems reviewed and are negative.    Physical Exam Updated Vital Signs BP (!) 104/59    Pulse 76    Temp 99.4 F (37.4 C) (Oral)    Resp 16    Ht 5\' 4"  (1.626 m)    Wt 45.4 kg    SpO2 99%    BMI 17.16 kg/m   Physical Exam Vitals signs and nursing note reviewed.  Constitutional:      Appearance: Normal appearance.  HENT:     Head: Normocephalic and atraumatic.     Right Ear: External ear normal.     Left Ear: External ear normal.     Nose: Nose normal.     Mouth/Throat:     Mouth: Mucous membranes are dry.  Eyes:     Extraocular Movements: Extraocular movements intact.     Conjunctiva/sclera: Conjunctivae normal.     Pupils: Pupils are equal, round, and reactive to light.  Neck:     Musculoskeletal: Normal range of motion and neck supple.  Cardiovascular:     Rate and Rhythm: Regular rhythm. Tachycardia present.     Pulses: Normal pulses.     Heart sounds: Normal heart sounds.  Pulmonary:     Effort: Pulmonary effort is normal.  Breath sounds: Normal breath sounds.  Abdominal:     General: Abdomen is flat. Bowel sounds are normal.     Palpations: Abdomen is soft.     Tenderness: There is abdominal tenderness in the epigastric area.  Musculoskeletal: Normal range of motion.  Skin:    General: Skin is warm.     Capillary Refill: Capillary refill takes less than 2 seconds.  Neurological:     General: No focal deficit present.     Mental Status: She is alert and oriented to person, place, and time.  Psychiatric:        Mood and Affect: Mood normal.        Behavior: Behavior normal.      ED Treatments / Results  Labs (all labs ordered are listed, but only abnormal results are displayed) Labs Reviewed  COMPREHENSIVE METABOLIC PANEL - Abnormal; Notable for the following components:      Result Value   Glucose, Bld 103 (*)    Total Protein 8.5 (*)    All other components  within normal limits  CBC - Abnormal; Notable for the following components:   Hemoglobin 11.7 (*)    MCV 72.9 (*)    MCH 22.9 (*)    RDW 18.6 (*)    All other components within normal limits  URINALYSIS, ROUTINE W REFLEX MICROSCOPIC - Abnormal; Notable for the following components:   APPearance HAZY (*)    Specific Gravity, Urine 1.035 (*)    Ketones, ur 80 (*)    Protein, ur 100 (*)    Bacteria, UA RARE (*)    All other components within normal limits  SARS CORONAVIRUS 2 (TAT 6-24 HRS)  LIPASE, BLOOD  I-STAT BETA HCG BLOOD, ED ([facility], [facility], [facility] ONLY)    EKG None  Radiology Ct Abdomen Pelvis W Contrast  Result Date: 06/13/2019 CLINICAL DATA:  Generalized abdominal pain EXAM: CT ABDOMEN AND PELVIS WITH CONTRAST TECHNIQUE: Multidetector CT imaging of the abdomen and pelvis was performed using the standard protocol following bolus administration of intravenous contrast. CONTRAST:  OMNIPAQUE IOHEXOL 300 MG/ML  SOLN COMPARISON:  None. FINDINGS: Lower chest: No acute abnormality. Hepatobiliary: No focal liver abnormality is seen. No gallstones, gallbladder wall thickening, or biliary dilatation. Pancreas: Unremarkable. No pancreatic ductal dilatation or surrounding inflammatory changes. Spleen: Normal in size without focal abnormality. Adrenals/Urinary Tract: Adrenal glands are within normal limits bilaterally. Kidneys demonstrate normal enhancement pattern with normal excretion of contrast material bilaterally. The bladder is decompressed. Stomach/Bowel: The appendix is well visualized and partially air-filled. No significant inflammatory changes are noted. Small bowel and stomach appear within normal limits. Vascular/Lymphatic: No significant vascular findings are present. No enlarged abdominal or pelvic lymph nodes. Reproductive: Dominant right ovarian cyst is noted measuring 2 cm in greatest dimension. The ovaries demonstrate follicular changes bilaterally. Uterus appears within normal limits.  Other: No abdominal wall hernia or abnormality. No abdominopelvic ascites. Musculoskeletal: No acute or significant osseous findings. IMPRESSION: Normal-appearing appendix. 2 cm right ovarian cyst without complicating factors. No other focal abnormality is noted. Electronically Signed   By: Alcide Clever M.D.   On: 06/13/2019 15:50    Procedures Procedures (including critical care time)  Medications Ordered in ED Medications  sodium chloride flush (NS) 0.9 % injection 3 mL (3 mLs Intravenous Given 06/13/19 1005)  sodium chloride 0.9 % bolus 1,000 mL (0 mLs Intravenous Stopped 06/13/19 1115)  ondansetron (ZOFRAN) injection 4 mg (4 mg Intravenous Given 06/13/19 1006)  promethazine (PHENERGAN) injection 12.5 mg (12.5  mg Intravenous Given 06/13/19 1335)  morphine 4 MG/ML injection 4 mg (4 mg Intravenous Given 06/13/19 1350)  sodium chloride 0.9 % bolus 1,000 mL (1,000 mLs Intravenous New Bag/Given 06/13/19 1514)  iohexol (OMNIPAQUE) 300 MG/ML solution 100 mL (100 mLs Intravenous Contrast Given 06/13/19 1543)     Initial Impression / Assessment and Plan / ED Course  I have reviewed the triage vital signs and the nursing notes.  Pertinent labs & imaging results that were available during my care of the patient were reviewed by me and considered in my medical decision making (see chart for details).     Pt is feeling much better.  She is tolerating po fluids.  The pt's mom said pt has vomiting all the time.  She has been to GI and has had an endoscopy and a colonoscopy.  Pt does use a lot of marijuana.  That could be the cause.  Pt told to avoid MJ.   Final Clinical Impressions(s) / ED Diagnoses   Final diagnoses:  Non-intractable vomiting with nausea, unspecified vomiting type  Cannabinoid hyperemesis syndrome    ED Discharge Orders         Ordered    ondansetron (ZOFRAN ODT) 4 MG disintegrating tablet  Every 8 hours PRN     06/13/19 1607           Isla Pence, MD 06/13/19 1609

## 2019-06-13 NOTE — ED Notes (Signed)
Patient verbalizes understanding of discharge instructions. Opportunity for questioning and answers were provided. Armband removed by staff, pt discharged from ED in wheelchair to home.   

## 2019-06-13 NOTE — ED Notes (Signed)
Patient PO challenge with soda then water. Patient had one emesis x1 doctor notified.

## 2019-06-13 NOTE — ED Notes (Signed)
Pt tolerated Fluid intake well with no emesis

## 2019-06-13 NOTE — ED Notes (Signed)
Mother stated patient was suppose to get an CT of patient's abdomen ordered by her doctor but has not. Doctor notified. Patient general abdominal pain 8/10 achy dull.

## 2019-08-11 ENCOUNTER — Other Ambulatory Visit: Payer: Self-pay

## 2019-08-11 ENCOUNTER — Ambulatory Visit (HOSPITAL_COMMUNITY)
Admission: EM | Admit: 2019-08-11 | Discharge: 2019-08-11 | Disposition: A | Discharge status: Home / Self Care | Payer: Managed Care, Other (non HMO)

## 2019-08-11 NOTE — ED Notes (Signed)
Taylor Golden, patient access reports patient was wanting an ultrasound.  Patient left after being told we do not do that test here.

## 2019-08-12 ENCOUNTER — Inpatient Hospital Stay (HOSPITAL_COMMUNITY)
Admission: AD | Admit: 2019-08-12 | Discharge: 2019-08-12 | Disposition: A | Discharge status: Home / Self Care | Payer: Managed Care, Other (non HMO) | Attending: Obstetrics & Gynecology | Admitting: Obstetrics & Gynecology

## 2019-08-12 ENCOUNTER — Inpatient Hospital Stay (HOSPITAL_COMMUNITY): Payer: Managed Care, Other (non HMO)

## 2019-08-12 ENCOUNTER — Encounter (HOSPITAL_COMMUNITY): Payer: Self-pay | Admitting: *Deleted

## 2019-08-12 DIAGNOSIS — Z3A01 Less than 8 weeks gestation of pregnancy: Secondary | ICD-10-CM | POA: Insufficient documentation

## 2019-08-12 DIAGNOSIS — D649 Anemia, unspecified: Secondary | ICD-10-CM | POA: Insufficient documentation

## 2019-08-12 DIAGNOSIS — B373 Candidiasis of vulva and vagina: Secondary | ICD-10-CM | POA: Diagnosis not present

## 2019-08-12 DIAGNOSIS — R102 Pelvic and perineal pain: Secondary | ICD-10-CM | POA: Insufficient documentation

## 2019-08-12 DIAGNOSIS — F1290 Cannabis use, unspecified, uncomplicated: Secondary | ICD-10-CM | POA: Diagnosis not present

## 2019-08-12 DIAGNOSIS — O210 Mild hyperemesis gravidarum: Secondary | ICD-10-CM | POA: Diagnosis present

## 2019-08-12 DIAGNOSIS — O26891 Other specified pregnancy related conditions, first trimester: Secondary | ICD-10-CM

## 2019-08-12 DIAGNOSIS — R12 Heartburn: Secondary | ICD-10-CM

## 2019-08-12 DIAGNOSIS — O99011 Anemia complicating pregnancy, first trimester: Secondary | ICD-10-CM | POA: Diagnosis not present

## 2019-08-12 DIAGNOSIS — O98811 Other maternal infectious and parasitic diseases complicating pregnancy, first trimester: Secondary | ICD-10-CM | POA: Insufficient documentation

## 2019-08-12 DIAGNOSIS — O219 Vomiting of pregnancy, unspecified: Secondary | ICD-10-CM | POA: Diagnosis not present

## 2019-08-12 DIAGNOSIS — B3731 Acute candidiasis of vulva and vagina: Secondary | ICD-10-CM

## 2019-08-12 DIAGNOSIS — O99321 Drug use complicating pregnancy, first trimester: Secondary | ICD-10-CM | POA: Insufficient documentation

## 2019-08-12 DIAGNOSIS — O26899 Other specified pregnancy related conditions, unspecified trimester: Secondary | ICD-10-CM

## 2019-08-12 HISTORY — DX: Other specified health status: Z78.9

## 2019-08-12 LAB — CBC
HCT: 35 % — ABNORMAL LOW (ref 36.0–46.0)
Hemoglobin: 10.8 g/dL — ABNORMAL LOW (ref 12.0–15.0)
MCH: 22.8 pg — ABNORMAL LOW (ref 26.0–34.0)
MCHC: 30.9 g/dL (ref 30.0–36.0)
MCV: 74 fL — ABNORMAL LOW (ref 80.0–100.0)
Platelets: 418 10*3/uL — ABNORMAL HIGH (ref 150–400)
RBC: 4.73 MIL/uL (ref 3.87–5.11)
RDW: 17.3 % — ABNORMAL HIGH (ref 11.5–15.5)
WBC: 7.1 10*3/uL (ref 4.0–10.5)
nRBC: 0 % (ref 0.0–0.2)

## 2019-08-12 LAB — URINALYSIS, ROUTINE W REFLEX MICROSCOPIC
Bilirubin Urine: NEGATIVE
Glucose, UA: NEGATIVE mg/dL
Ketones, ur: 80 mg/dL — AB
Nitrite: NEGATIVE
Protein, ur: 30 mg/dL — AB
Specific Gravity, Urine: 1.028 (ref 1.005–1.030)
pH: 5 (ref 5.0–8.0)

## 2019-08-12 LAB — COMPREHENSIVE METABOLIC PANEL
ALT: 17 U/L (ref 0–44)
AST: 29 U/L (ref 15–41)
Albumin: 4.7 g/dL (ref 3.5–5.0)
Alkaline Phosphatase: 39 U/L (ref 38–126)
Anion gap: 14 (ref 5–15)
BUN: 12 mg/dL (ref 6–20)
CO2: 17 mmol/L — ABNORMAL LOW (ref 22–32)
Calcium: 9.9 mg/dL (ref 8.9–10.3)
Chloride: 104 mmol/L (ref 98–111)
Creatinine, Ser: 0.74 mg/dL (ref 0.44–1.00)
GFR calc Af Amer: >60 mL/min (ref >60)
GFR calc non Af Amer: >60 mL/min (ref >60)
Glucose, Bld: 111 mg/dL — ABNORMAL HIGH (ref 70–99)
Potassium: 4.9 mmol/L (ref 3.5–5.1)
Sodium: 135 mmol/L (ref 135–145)
Total Bilirubin: <0.1 mg/dL — ABNORMAL LOW (ref 0.3–1.2)
Total Protein: 8.2 g/dL — ABNORMAL HIGH (ref 6.5–8.1)

## 2019-08-12 LAB — WET PREP, GENITAL
Clue Cells Wet Prep HPF POC: NONE SEEN
Sperm: NONE SEEN
Trich, Wet Prep: NONE SEEN

## 2019-08-12 LAB — ABO/RH: ABO/RH(D): B POS

## 2019-08-12 LAB — HCG, QUANTITATIVE, PREGNANCY: hCG, Beta Chain, Quant, S: 71993 m[IU]/mL — ABNORMAL HIGH (ref <5)

## 2019-08-12 LAB — POCT PREGNANCY, URINE: Preg Test, Ur: POSITIVE — AB

## 2019-08-12 MED ORDER — PROMETHAZINE HCL 12.5 MG PO TABS: 12.5000 mg | ORAL_TABLET | Freq: Four times a day (QID) | ORAL | 0 refills | Status: AC | PRN

## 2019-08-12 MED ORDER — FAMOTIDINE 20 MG PO TABS: 20.0000 mg | ORAL_TABLET | Freq: Every day | ORAL | 1 refills | Status: AC

## 2019-08-12 MED ORDER — TERCONAZOLE 0.4 % VA CREA: 1.0000 | TOPICAL_CREAM | Freq: Every day | VAGINAL | 0 refills | Status: AC

## 2019-08-12 MED ORDER — LACTATED RINGERS IV BOLUS
1000.0000 mL | Freq: Once | INTRAVENOUS | Status: AC
  Administered 2019-08-12: 13:00:00 1000 mL via INTRAVENOUS

## 2019-08-12 MED ORDER — PROMETHAZINE HCL 25 MG/ML IJ SOLN
12.5000 mg | Freq: Once | INTRAMUSCULAR | Status: AC
  Administered 2019-08-12: 13:00:00 12.5 mg via INTRAVENOUS
  Filled 2019-08-12: qty 1

## 2019-08-12 MED ORDER — FAMOTIDINE IN NACL 20-0.9 MG/50ML-% IV SOLN
20.0000 mg | Freq: Once | INTRAVENOUS | Status: AC
  Administered 2019-08-12: 20 mg via INTRAVENOUS
  Filled 2019-08-12: qty 50

## 2019-08-12 MED ORDER — FERROUS SULFATE 325 (65 FE) MG PO TABS: 325.0000 mg | ORAL_TABLET | Freq: Every day | ORAL | 3 refills | Status: AC

## 2019-08-12 NOTE — Discharge Instructions (Signed)
Marijuana Use During Pregnancy and Breastfeeding  Marijuana is the dried leaves, flowers, and stems of the Cannabis sativa or Cannabis indica plant. The plant's active ingredients (cannabinoids), including a chemical called THC, change the chemistry of the brain. Marijuana smoke also has many of the same chemicals as cigarette smoke that cause breathing problems. Marijuana gets into your blood through your lungs when you smoke it and through your digestive system when you swallow it. Using marijuana in any form may be harmful for you and your baby when you are trying to become pregnant and during pregnancy. This includes marijuana that is prescribed to you by a health care provider (medical marijuana). Once marijuana is in your blood, it can travel through your placenta to your baby. It may also pass through breast milk. How does this affect me? Marijuana affects you both mentally and physically. Using marijuana can make you feel high and relaxed. It can also have negative effects, especially at high doses or with long-term use. These include:  Rapid heartbeat and stress on your heart.  Lung irritation and breathing problems.  Difficulty thinking and making decisions.  Seeing or believing things that are not true (hallucinations and paranoia).  Mood swings, depression, or anxiety.  Decreased ability to learn and remember.  Difficulty getting pregnant. Marijuana can also affect your pregnancy. Not all the effects are known. However, if you use marijuana during pregnancy, you may:  Be less likely to get regular prenatal care and do the things that you need to do to have a healthy pregnancy.  Be more likely to use other drugs that can harm your pregnancy, like drinking alcohol and smoking cigarettes.  Be at higher risk of having your baby die after 28 weeks of pregnancy (stillbirth).  Be at higher risk of giving birth before 37 weeks of pregnancy (premature birth). How does this affect my  baby? If you use marijuana during pregnancy, this may affect your baby's development, birth, and life after birth. Your baby may:  Be born prematurely, which can cause physical and mental problems.  Be born with a low birth weight, which can lead to physical and mental problems.  Have problems with brain development.  Have difficulty growing.  Have attention and behavior problems later in life.  Do poorly at school and have learning problems later in life.  Have problems with vision and coordination.  Be at higher risk for using marijuana by age 38. More research is needed to find out exactly how marijuana affects a baby during breastfeeding. Some studies suggest that the chemicals in marijuana can be passed to a baby through breast milk. To limit possible risks, you should not use marijuana during breastfeeding. Follow these instructions at home:  Let your health care provider know if you use marijuana before trying to get pregnant, during pregnancy, or during breastfeeding.  Do not use marijuana in any form when you are trying to get pregnant, when you are pregnant, or when you are breastfeeding. If you are having trouble stopping marijuana use, ask your health care provider for help.  Do not smoke. If you need help quitting, ask your health care provider for help.  If you are using medical marijuana, ask your health care provider to switch you to a medicine that is safer to use during pregnancy or breastfeeding.  Keep all your prenatal visits as told by your health care provider. This is important. Where to find more information [employer] on Drug Abuse: www.drugabuse.gov March of Dimes:  www.marchofdimes.org/pregnancy Contact a health care provider if:  You use marijuana and want to get pregnant.  You use marijuana during pregnancy or breastfeeding.  You need help stopping marijuana use. Get help right away if:  Your baby is not gaining weight or growing as  expected. Summary  Using marijuana in any form may be harmful for you and your baby when you are trying to become pregnant, during pregnancy, and during breastfeeding. This includes marijuana that is prescribed to you (medical marijuana).  Some studies suggest that marijuana may pass through breast milk and can affect your baby's brain development.  Talk to your health care provider if you use marijuana in any form while trying to get pregnant, during pregnancy, or while breastfeeding.  Ask your health care provider for help if you are not able to stop using marijuana. This information is not intended to replace advice given to you by your health care provider. Make sure you discuss any questions you have with your health care provider. Document Revised: 10/15/2018 Document Reviewed: 03/11/2017 Elsevier Patient Education  [place]. Heartburn During Pregnancy Heartburn is a type of pain or discomfort that can happen in the throat or chest. It is often described as a burning sensation. Heartburn is common during pregnancy because:  A hormone (progesterone) that is released during pregnancy may relax the valve (lower esophageal sphincter, or LES) that separates the esophagus from the stomach. This allows stomach acid to move up into the esophagus, causing heartburn.  The uterus gets larger and pushes up on the stomach, which pushes more acid into the esophagus. This is especially true in the later stages of pregnancy. Heartburn usually goes away or gets better after giving birth. What are the causes? Heartburn is caused by stomach acid backing up into the esophagus (reflux). Reflux can be triggered by:  Changing hormone levels.  Large meals.  Certain foods and beverages, such as coffee, chocolate, onions, and peppermint.  Exercise.  Increased stomach acid production. What increases the risk? You are more likely to experience heartburn during pregnancy if you:  Had heartburn  prior to becoming pregnant.  Have been pregnant more than once before.  Are overweight or obese. The likelihood that you will get heartburn also increases as you get farther along in your pregnancy, especially during the last trimester. What are the signs or symptoms? Symptoms of this condition include:  Burning pain in the chest or lower throat.  Bitter taste in the mouth.  Coughing.  Problems swallowing.  Vomiting.  Hoarse voice.  Asthma. Symptoms may get worse when you lie down or bend over. Symptoms are often worse at night. How is this diagnosed? This condition is diagnosed based on:  Your medical history.  Your symptoms.  Blood tests to check for a certain type of bacteria associated with heartburn.  Whether taking heartburn medicine relieves your symptoms.  Examination of the stomach and esophagus using a tube with a light and camera on the end (endoscopy). How is this treated? Treatment varies depending on how severe your symptoms are. Your health care provider may recommend:  Over-the-counter medicines (antacids or acid reducers) for mild heartburn.  Prescription medicines to decrease stomach acid or to protect your stomach lining.  Certain changes in your diet.  Raising the head of your bed so it is higher than the foot of the bed. This helps prevent stomach acid from backing up into the esophagus when you are lying down. Follow these instructions at home: Eating and  drinking  Do not drink alcohol during your pregnancy.  Identify foods and beverages that make your symptoms worse, and avoid them.  Beverages that you may want to avoid include: ? Coffee and tea (with or without caffeine). ? Energy drinks and sports drinks. ? Carbonated drinks or sodas. ? Citrus fruit juices.  Foods that you may want to avoid include: ? Chocolate and cocoa. ? Peppermint and mint flavorings. ? Garlic, onions, and horseradish. ? Spicy and acidic foods, including  peppers, chili powder, curry powder, vinegar, hot sauces, and barbecue sauce. ? Citrus fruits, such as oranges, lemons, and limes. ? Tomato-based foods, such as red sauce, chili, and salsa. ? Fried and fatty foods, such as donuts, french fries, potato chips, and high-fat dressings. ? High-fat meats, such as hot dogs, cold cuts, sausage, ham, and bacon. ? High-fat dairy items, such as whole milk, butter, and cheese.  Eat small, frequent meals instead of large meals.  Avoid drinking large amounts of liquid with your meals.  Avoid eating meals during the 2-3 hours before bedtime.  Avoid lying down right after you eat.  Do not exercise right after you eat. Medicines  Take over-the-counter and prescription medicines only as told by your health care provider.  Do not take aspirin, ibuprofen, or other NSAIDs unless your health care provider tells you to do that.  You may be instructed to avoid medicines that contain sodium bicarbonate. General instructions   If directed, raise the head of your bed about 6 inches (15 cm) by putting blocks under the legs. Sleeping with more pillows does not effectively relieve heartburn because it only changes the position of your head.  Do not use any products that contain nicotine or tobacco, such as cigarettes and e-cigarettes. If you need help quitting, ask your health care provider.  Wear loose-fitting clothing.  Try to reduce your stress, such as with yoga or meditation. If you need help managing stress, ask your health care provider.  Maintain a healthy weight. If you are overweight, work with your health care provider to safely lose weight.  Keep all follow-up visits as told by your health care provider. This is important. Contact a health care provider if:  You develop new symptoms.  Your symptoms do not improve with treatment.  You have unexplained weight loss.  You have difficulty swallowing.  You make loud sounds when you breathe  (wheeze).  You have a cough that does not go away.  You have frequent heartburn for more than 2 weeks.  You have nausea or vomiting that does not get better with treatment.  You have pain in your abdomen. Get help right away if:  You have severe chest pain that spreads to your arm, neck, or jaw.  You feel sweaty, dizzy, or light-headed.  You have shortness of breath.  You have pain when swallowing.  You vomit, and your vomit looks like blood or coffee grounds.  Your stool is bloody or black. This information is not intended to replace advice given to you by your health care provider. Make sure you discuss any questions you have with your health care provider. Document Revised: 09/21/2017 Document Reviewed: 03/10/2016 Elsevier Patient Education  2020 [employer]  Pregnancy and Anemia  Anemia is a condition in which the concentration of red blood cells, or hemoglobin, in the blood is below normal. Hemoglobin is a substance in red blood cells that carries oxygen to the tissues of the body. Anemia results when enough oxygen does not  reach these tissues. Anemia is common during pregnancy because the woman's body needs more blood volume and blood cells to provide nutrition to the fetus. The fetus needs iron and folic acid as it is developing. Your body may not produce enough red blood cells because of this. Also, during pregnancy, the liquid part of the blood (plasma) increases by about 30-50%, and the red blood cells increase by only 20%. This lowers the concentration of the red blood cells and creates a natural anemia-like situation. What are the causes? The most common cause of anemia during pregnancy is not having enough iron in the body to make red blood cells (iron deficiency anemia). Other causes may include:  Folic acid deficiency.  Vitamin B12 deficiency.  Certain prescription or over-the-counter medicines.  Certain medical conditions or infections that destroy red blood  cells.  A low platelet count and bleeding caused by antibodies that go through the placenta to the fetus from the mother's blood. What are the signs or symptoms? Mild anemia may not be noticeable. If it becomes severe, symptoms may include:  Feeling tired (fatigue).  Shortness of breath, especially during activity.  Weakness.  Fainting.  Pale looking skin.  Headaches.  A fast or irregular heartbeat (palpitations).  Dizziness. How is this diagnosed? This condition may be diagnosed based on:  Your medical history and a physical exam.  Blood tests. How is this treated? Treatment for anemia during pregnancy depends on the cause of the anemia. Treatment can include:  Dietary changes.  Supplements of iron, vitamin B12, or folic acid.  A blood transfusion. This may be needed if anemia is severe.  Hospitalization. This may be needed if there is a lot of blood loss or severe anemia. Follow these instructions at home:  Follow recommendations from your dietitian or health care provider about changing your diet.  Increase your vitamin C intake. This will help the stomach absorb more iron. Some foods that are high in vitamin C include: ? Oranges. ? Peppers. ? Tomatoes. ? Mangoes.  Eat a diet rich in iron. This would include foods such as: ? Liver. ? Beef. ? Eggs. ? Whole grains. ? Spinach. ? Dried fruit.  Take iron and vitamins as told by your health care provider.  Eat green leafy vegetables. These are a good source of folic acid.  Keep all follow-up visits as told by your health care provider. This is important. Contact a health care provider if:  You have frequent or lasting headaches.  You look pale.  You bruise easily. Get help right away if:  You have extreme weakness, shortness of breath, or chest pain.  You become dizzy or have trouble concentrating.  You have heavy vaginal bleeding.  You develop a rash.  You have bloody or black, tarry  stools.  You faint.  You vomit up blood.  You vomit repeatedly.  You have abdominal pain.  You have a fever.  You are dehydrated. Summary  Anemia is a condition in which the concentration of red blood cells or hemoglobin in the blood is below normal.  Anemia is common during pregnancy because the woman's body needs more blood volume and blood cells to provide nutrition to the fetus.  The most common cause of anemia during pregnancy is not having enough iron in the body to make red blood cells (iron deficiency anemia).  Mild anemia may not be noticeable. If it becomes severe, symptoms may include feeling tired and weak. This information is not intended to replace  advice given to you by your health care provider. Make sure you discuss any questions you have with your health care provider. Document Revised: 12/07/2018 Document Reviewed: 07/29/2016 Elsevier Patient Education  2020 [employer].

## 2019-08-12 NOTE — MAU Note (Addendum)
Thinks she needs an IV, thinks she  Is dehydrated, chest is hurting real bad-burning. +HPT 2 days ago. Cramping in lower abd, no bleeding.  has been vomiting,had loose stools last night

## 2019-08-12 NOTE — Progress Notes (Signed)
GC/Chlamydia & wet prep cultures obtained by this nurse.

## 2019-08-12 NOTE — MAU Provider Note (Signed)
History     CSN: [id number]  Arrival date and time: 08/12/19 1049   First Provider Initiated Contact with Patient 08/12/19 1226      Chief Complaint  Patient presents with  . Emesis  . Possible Pregnancy  . Abdominal Pain  . Heartburn   [name] is a 24 y.o. G1P0 at [redacted]w[redacted]d who presents to MAU for N/V. Pt endorses smoking marijuana and reports she took a hot shower last night, which relieved her symptoms temporarily.  Onset: early this week, worsened last night Location: stomach Duration: per above Character: constant nausea, vomited x10-20 in past 24hrs, pt has not been able to eat or drink anything in the past 24hrs Aggravating/Associated: none/acid reflux, pelvic cramping, white/clumped discharge Relieving: certain sleeping positions Treatment: none  Pt denies VB, vaginal discharge/odor/itching. Pt denies abdominal pain, constipation, diarrhea, or urinary problems. Pt denies fever, chills, fatigue, sweating or changes in appetite. Pt denies SOB or chest pain. Pt denies dizziness, HA, light-headedness, weakness.  Problems this pregnancy include: pt has not yet been seen. Allergies? Peanut oil Current medications/supplements? none Prenatal care provider? Pt requests list of OB providers   OB History    Gravida  1   Para      Term      Preterm      AB      Living        SAB      TAB      Ectopic      Multiple      Live Births              Past Medical History:  Diagnosis Date  . Acne 2007  . Medical history non-contributory   . Seasonal allergies     Past Surgical History:  Procedure Laterality Date  . FOOT SURGERY Right 2012   Cyst Removal  . TONSILECTOMY, ADENOIDECTOMY, BILATERAL MYRINGOTOMY AND TUBES N/A 2004  . TONSILLECTOMY      Family History  Problem Relation Age of Onset  . Hypertension Mother   . Hypertension Maternal Grandmother     Social History   Tobacco Use  . Smoking status: Never Smoker  . Smokeless  tobacco: Never Used  Substance Use Topics  . Alcohol use: Not Currently  . Drug use: Not Currently    Types: Marijuana    Comment: last smoked 08/10/2019    Allergies:  Allergies  Allergen Reactions  . Peanut Oil Swelling    Medications Prior to Admission  Medication Sig Dispense Refill Last Dose  . iron polysaccharides (NU-IRON) 150 MG capsule Take by mouth.     . ondansetron (ZOFRAN ODT) 4 MG disintegrating tablet Take 1 tablet (4 mg total) by mouth every 8 (eight) hours as needed. 10 tablet 0   . ondansetron (ZOFRAN) 4 MG tablet Take 1 tablet (4 mg total) by mouth every 6 (six) hours. 12 tablet 0     Review of Systems  Constitutional: Negative for chills, diaphoresis, fatigue and fever.  Eyes: Negative for visual disturbance.  Respiratory: Negative for shortness of breath.   Cardiovascular: Negative for chest pain.  Gastrointestinal: Positive for nausea and vomiting. Negative for abdominal pain, constipation and diarrhea.       Acid reflux.  Genitourinary: Positive for pelvic pain (cramping). Negative for dysuria, flank pain, frequency, urgency, vaginal bleeding and vaginal discharge.  Neurological: Negative for dizziness, weakness, light-headedness and headaches.   Physical Exam   Blood pressure 133/82, pulse (!) 59, temperature 99.2 F (37.3  C), temperature source Oral, resp. rate 17, height 5\' 4"  (1.626 m), weight 49.1 kg, last menstrual period 06/25/2019, SpO2 100 %.  Patient Vitals for the past 24 hrs:  BP Temp Temp src Pulse Resp SpO2 Height Weight  08/12/19 1248 133/82 -- -- (!) 59 -- -- -- --  08/12/19 1102 (!) 152/87 99.2 F (37.3 C) Oral 76 17 100 % 5\' 4"  (1.626 m) 49.1 kg   Physical Exam  Nursing note and vitals reviewed. Constitutional: She is oriented to person, place, and time. She appears well-developed and well-nourished. No distress.  HENT:  Head: Normocephalic and atraumatic.  Respiratory: Effort normal.  GI: Soft. She exhibits no distension and no  mass. There is no abdominal tenderness. There is no rebound and no guarding.  Genitourinary:    No vaginal discharge.   Neurological: She is alert and oriented to person, place, and time.  Skin: Skin is warm and dry. She is not diaphoretic.  Psychiatric: She has a normal mood and affect. Her behavior is normal. Judgment and thought content normal.   Results for orders placed or performed during the hospital encounter of 08/12/19 (from the past 24 hour(s))  Pregnancy, urine POC     Status: Abnormal   Collection Time: 08/12/19 11:15 AM  Result Value Ref Range   Preg Test, Ur POSITIVE (A) NEGATIVE  Urinalysis, Routine w reflex microscopic     Status: Abnormal   Collection Time: 08/12/19 11:22 AM  Result Value Ref Range   Color, Urine YELLOW YELLOW   APPearance HAZY (A) CLEAR   Specific Gravity, Urine 1.028 1.005 - 1.030   pH 5.0 5.0 - 8.0   Glucose, UA NEGATIVE NEGATIVE mg/dL   Hgb urine dipstick MODERATE (A) NEGATIVE   Bilirubin Urine NEGATIVE NEGATIVE   Ketones, ur 80 (A) NEGATIVE mg/dL   Protein, ur 30 (A) NEGATIVE mg/dL   Nitrite NEGATIVE NEGATIVE   Leukocytes,Ua SMALL (A) NEGATIVE   RBC / HPF 0-5 0 - 5 RBC/hpf   WBC, UA 6-10 0 - 5 WBC/hpf   Bacteria, UA RARE (A) NONE SEEN   Squamous Epithelial / LPF 11-20 0 - 5   Mucus PRESENT   CBC     Status: Abnormal   Collection Time: 08/12/19  1:13 PM  Result Value Ref Range   WBC 7.1 4.0 - 10.5 K/uL   RBC 4.73 3.87 - 5.11 MIL/uL   Hemoglobin 10.8 (L) 12.0 - 15.0 g/dL   HCT 10/10/19 (L) 10/10/19 - 51.7 %   MCV 74.0 (L) 80.0 - 100.0 fL   MCH 22.8 (L) 26.0 - 34.0 pg   MCHC 30.9 30.0 - 36.0 g/dL   RDW 00.1 (H) 74.9 - 44.9 %   Platelets 418 (H) 150 - 400 K/uL   nRBC 0.0 0.0 - 0.2 %  Comprehensive metabolic panel     Status: Abnormal   Collection Time: 08/12/19  1:13 PM  Result Value Ref Range   Sodium 135 135 - 145 mmol/L   Potassium 4.9 3.5 - 5.1 mmol/L   Chloride 104 98 - 111 mmol/L   CO2 17 (L) 22 - 32 mmol/L   Glucose, Bld 111 (H) 70  - 99 mg/dL   BUN 12 6 - 20 mg/dL   Creatinine, Ser 91.6 0.44 - 1.00 mg/dL   Calcium 9.9 8.9 - 10/10/19 mg/dL   Total Protein 8.2 (H) 6.5 - 8.1 g/dL   Albumin 4.7 3.5 - 5.0 g/dL   AST 29 15 - 41 U/L  ALT 17 0 - 44 U/L   Alkaline Phosphatase 39 38 - 126 U/L   Total Bilirubin <0.1 (L) 0.3 - 1.2 mg/dL   GFR calc non Af Amer >60 >60 mL/min   GFR calc Af Amer >60 >60 mL/min   Anion gap 14 5 - 15  ABO/Rh     Status: None   Collection Time: 08/12/19  1:13 PM  Result Value Ref Range   ABO/RH(D) B POS    No rh immune globuloin      NOT A RH IMMUNE GLOBULIN CANDIDATE, PT RH POSITIVE Performed at [facility] [address], [place], [place] [address]   Wet prep, genital     Status: Abnormal   Collection Time: 08/12/19  1:13 PM   Specimen: Vaginal  Result Value Ref Range   Yeast Wet Prep HPF POC PRESENT (A) NONE SEEN   Trich, Wet Prep NONE SEEN NONE SEEN   Clue Cells Wet Prep HPF POC NONE SEEN NONE SEEN   WBC, Wet Prep HPF POC MANY (A) NONE SEEN   Sperm NONE SEEN   hCG, quantitative, pregnancy     Status: Abnormal   Collection Time: 08/12/19  1:13 PM  Result Value Ref Range   hCG, Beta Chain, Quant, S 71,993 (H) <5 mIU/mL   US OB LESS THAN 14 WEEKS WITH OB TRANSVAGINAL  Result Date: 08/12/2019 CLINICAL DATA:  Pelvic cramping, first trimester pregnancy EXAM: OBSTETRIC <14 WK [place] AND TRANSVAGINAL OB US TECHNIQUE: Both transabdominal and transvaginal ultrasound examinations were performed for complete evaluation of the gestation as well as the maternal uterus, adnexal regions, and pelvic cul-de-sac. Transvaginal technique was performed to assess early pregnancy. COMPARISON:  None FINDINGS: Intrauterine gestational sac: Present, single Yolk sac:  Present Embryo:  Present Cardiac Activity: Present Heart Rate: 130 bpm CRL:  9.1 mm   6 w   6 d                  [place] EDC: 03/31/2020 Subchorionic hemorrhage:  None visualized. Maternal uterus/adnexae: RIGHT ovary measures 4.6 x 5.6 x 4.2 cm and  contains a cyst 4.1 x 3.6 x 3.8 cm in size which contains a single thin septation. LEFT ovary normal size and morphology 3.7 x 1.7 x 2.0 cm. No free pelvic fluid or adnexal masses. IMPRESSION: Single live intrauterine gestation at 6 weeks 6 days EGA by crown-rump length. Minimally complicated cyst RIGHT ovary 4.1 cm diameter. Electronically Signed   By: Lavonia Dana M.D.   On: 08/12/2019 14:24   MAU Course  Procedures  MDM -N/V with acid reflux and current marijuana use -pelvic cramping with clumped/white discharge -r/o ectopic -UA: hazy/mod hgb/80ketones/30PRO/sm leuks/rare bacteria, urine sent for culture -CBC: H/H 10.8/35, will send iron -CMP: no abnormalities requiring treatment -[place]: single IUP, FHR 130, [redacted]w[redacted]d, minimally complicated right cyst with single, thin septation (4.1 x 3.6 x 3.8 cm in size) - per consultation with Dr. Harolyn Rutherford and Dr. Hulan Fray, no f/u needed. -hCG: 48,546 -ABO: B Positive -WetPrep: +yeast -GC/CT collected -1L LR + 20mg  Pepcid + 12.5mg  Phenergan given -pt reports heart burn has resolved -pt able to urinate after 1 bag of fluids -PO challenge successful -pt discharged to home in stable condition  Orders Placed This Encounter  Procedures  . Culture, OB Urine    Standing Status:   Standing    Number of Occurrences:   1  . Wet prep, genital    Standing Status:   Standing    Number  of Occurrences:   1  . US OB LESS THAN 14 WEEKS WITH OB TRANSVAGINAL    Standing Status:   Standing    Number of Occurrences:   1    Order Specific Question:   Symptom/Reason for Exam    Answer:   Pelvic cramping in antepartum period [170017]  . Urinalysis, Routine w reflex microscopic    Standing Status:   Standing    Number of Occurrences:   1  . CBC    Standing Status:   Standing    Number of Occurrences:   1  . Comprehensive metabolic panel    Standing Status:   Standing    Number of Occurrences:   1  . hCG, quantitative, pregnancy    Standing Status:   Standing    Number  of Occurrences:   1  . Pregnancy, urine POC    Standing Status:   Standing    Number of Occurrences:   1  . ABO/Rh    Standing Status:   Standing    Number of Occurrences:   1  . Insert peripheral IV    Standing Status:   Standing    Number of Occurrences:   1  . Discharge patient    Order Specific Question:   Discharge disposition    Answer:   01-Home or Self Care [1]    Order Specific Question:   Discharge patient date    Answer:   08/12/2019   Meds ordered this encounter  Medications  . lactated ringers bolus 1,000 mL  . famotidine (PEPCID) IVPB 20 mg premix  . promethazine (PHENERGAN) injection 12.5 mg  . ferrous sulfate 325 (65 FE) MG tablet    Sig: Take 1 tablet (325 mg total) by mouth daily.    Dispense:  30 tablet    Refill:  3    Order Specific Question:   Supervising Provider    Answer:   Jaynie Collins A [[id number]]  . terconazole (TERAZOL 7) 0.4 % vaginal cream    Sig: Place 1 applicator vaginally at bedtime for 7 days.    Dispense:  45 g    Refill:  0    Order Specific Question:   Supervising Provider    Answer:   Jaynie Collins A [[id number]]  . promethazine (PHENERGAN) 12.5 MG tablet    Sig: Take 1 tablet (12.5 mg total) by mouth every 6 (six) hours as needed for nausea or vomiting.    Dispense:  30 tablet    Refill:  0    Order Specific Question:   Supervising Provider    Answer:   Jaynie Collins A [[id number]]  . famotidine (PEPCID) 20 MG tablet    Sig: Take 1 tablet (20 mg total) by mouth daily.    Dispense:  30 tablet    Refill:  1    Order Specific Question:   Supervising Provider    Answer:   Jaynie Collins A [[id number]]   Assessment and Plan   1. Nausea and vomiting in pregnancy   2. Pelvic cramping in antepartum period   3. Heartburn in pregnancy in first trimester   4. Anemia during pregnancy in first trimester   5. Vaginal yeast infection   6. Marijuana use    Allergies as of 08/12/2019      Reactions   Peanut Oil Swelling      Medication List    STOP  taking these medications   Nu-Iron 150 MG capsule Generic drug: iron polysaccharides  ondansetron 4 MG disintegrating tablet Commonly known as: Zofran ODT   ondansetron 4 MG tablet Commonly known as: ZOFRAN     TAKE these medications   famotidine 20 MG tablet Commonly known as: PEPCID Take 1 tablet (20 mg total) by mouth daily.   ferrous sulfate 325 (65 FE) MG tablet Take 1 tablet (325 mg total) by mouth daily.   promethazine 12.5 MG tablet Commonly known as: PHENERGAN Take 1 tablet (12.5 mg total) by mouth every 6 (six) hours as needed for nausea or vomiting.   terconazole 0.4 % vaginal cream Commonly known as: TERAZOL 7 Place 1 applicator vaginally at bedtime for 7 days.      -will call with culture results, if positive -RX ferrous sulfate -RX terazole -RX Pepcid -RX Phenergan -list of OB providers given -safe meds in pregnancy list given -pt advised to make NOB appt -pt advised not to restart smoking marijuana for N/V or other reason as this can worsen symptoms of N/V, information given on cannabis hyperemesis -pt advised to take medications around the clock and not to stop taking if feeling better -discussed nonpharmacologic and pharmacologic treatments of N/V -discussed normal expectations for N/V in pregnancy -pt discharged to home in stable condition  Joni Reining E Amillya Chavira 08/12/2019, 4:55 PM

## 2019-08-13 LAB — CULTURE, OB URINE: Culture: <10000 — AB

## 2019-08-15 ENCOUNTER — Telehealth: Payer: Self-pay | Admitting: Women's Health

## 2019-08-15 LAB — GC/CHLAMYDIA PROBE AMP (~~LOC~~) NOT AT ARMC
Chlamydia: POSITIVE — AB
Comment: NEGATIVE
Comment: NORMAL
Neisseria Gonorrhea: NEGATIVE

## 2019-08-15 NOTE — Telephone Encounter (Signed)
Attempted to call patient to notify of +CT. Phone did not ring, voicemail left requesting return phone call.  Cledith Abdou, Odie Sera, NP  1:32 PM 08/15/2019

## 2019-08-16 ENCOUNTER — Telehealth (HOSPITAL_COMMUNITY): Payer: Self-pay

## 2019-08-16 NOTE — Progress Notes (Signed)
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  Boyd Kerbs wanted to know if pt medication was sent for tx.  Per chart review, I informed Boyd Kerbs that tx has not been seen.  Boyd Kerbs stated that she will go ahead a tx pt with Zithromax 1 gm.    Addison Naegeli, RN 08/16/19

## 2020-01-04 ENCOUNTER — Ambulatory Visit: Payer: Managed Care, Other (non HMO)

## 2021-10-30 ENCOUNTER — Ambulatory Visit
Admission: EM | Admit: 2021-10-30 | Discharge: 2021-10-30 | Disposition: A | Discharge status: Other Acute Inpatient Hospital | Payer: Managed Care, Other (non HMO)

## 2021-10-30 ENCOUNTER — Encounter (HOSPITAL_COMMUNITY): Payer: Self-pay

## 2021-10-30 ENCOUNTER — Emergency Department (HOSPITAL_COMMUNITY)
Admission: EM | Admit: 2021-10-30 | Discharge: 2021-10-30 | Disposition: A | Discharge status: Home / Self Care | Payer: BLUE CROSS/BLUE SHIELD | Attending: Emergency Medicine | Admitting: Emergency Medicine

## 2021-10-30 ENCOUNTER — Other Ambulatory Visit: Payer: Self-pay

## 2021-10-30 DIAGNOSIS — Z91010 Allergy to peanuts: Secondary | ICD-10-CM | POA: Insufficient documentation

## 2021-10-30 DIAGNOSIS — R519 Headache, unspecified: Secondary | ICD-10-CM | POA: Diagnosis not present

## 2021-10-30 DIAGNOSIS — R040 Epistaxis: Secondary | ICD-10-CM | POA: Diagnosis present

## 2021-10-30 NOTE — Discharge Instructions (Signed)
You came to the emergency department today to be evaluated for your nosebleed and headache.  Your physical exam was reassuring.  Please use Flonase over the next 3 days.  Please discuss methods of controlling nosebleeds as we discussed.  Please follow-up with your primary care doctor. ? ?Get help right away if: ?You have a nosebleed after a fall or a head injury. ?Your nosebleed does not go away after 20 minutes. ?You feel dizzy or weak. ?You have unusual bleeding from other parts of your body. ?You have unusual bruising on other parts of your body. ?You become sweaty. ?You vomit blood. ?

## 2021-10-30 NOTE — ED Provider Notes (Signed)
?[facility] EMERGENCY DEPARTMENT ?Provider Note ? ? ?CSN: [id number] ?Arrival date & time: 10/30/21  1237 ? ?  ? ?History ? ?Chief Complaint  ?Patient presents with  ? Epistaxis  ? ? ?Taylor Golden is a 26 y.o. female with pertinent history of seasonal allergies.  Presents emergency department with a chief complaint of epistaxis and headache.  Patient reports that she has been having episodes of epistaxis since Sunday.  Latest episode occurred today.  Patient reports that bleeding lasted for approximate 45 minutes however patient did not apply any pressure to her nose or try any means to stop the bleeding.  Describes losing is a slow trickle.  Blood was bright red in color.  Patient denies any nasal trauma but reports that she has been blowing her nose frequently over the last few days.  Patient also reports that she has been putting tissues of her nose when bleeding is present at times.  Denies any blood thinner use. ? ?Patient reports that today she developed a headache after her nosebleed started.  Headache onset was gradual pain progressively worse over time.  Pain was located to the frontal temporal aspect of her head.  Patient reports that pain was worse with loud noises.  Denies any associated numbness, weakness, facial asymmetry, dysarthria, diplopia, blurred vision. ? ? ? ? ?Epistaxis ?Associated symptoms: headaches   ?Associated symptoms: no dizziness, no fever and no sinus pain   ? ?  ? ?Home Medications ?Prior to Admission medications   ?Medication Sig Start Date End Date Taking? Authorizing Provider  ?famotidine (PEPCID) 20 MG tablet Take 1 tablet (20 mg total) by mouth daily. 08/12/19 08/11/20  Nugent, Odie Sera, NP  ?ferrous sulfate 325 (65 FE) MG tablet Take 1 tablet (325 mg total) by mouth daily. 08/12/19 08/11/20  Nugent, Odie Sera, NP  ?promethazine (PHENERGAN) 12.5 MG tablet Take 1 tablet (12.5 mg total) by mouth every 6 (six) hours as needed for nausea or vomiting. 08/12/19   Nugent,  Odie Sera, NP  ?   ? ?Allergies    ?Peanut oil   ? ?Review of Systems   ?Review of Systems  ?Constitutional:  Negative for chills and fever.  ?HENT:  Positive for nosebleeds. Negative for facial swelling, sinus pressure and sinus pain.   ?Eyes:  Negative for visual disturbance.  ?Respiratory:  Negative for shortness of breath.   ?Cardiovascular:  Negative for chest pain.  ?Gastrointestinal:  Negative for abdominal pain, nausea and vomiting.  ?Musculoskeletal:  Negative for back pain and neck pain.  ?Skin:  Negative for color change and rash.  ?Neurological:  Positive for headaches. Negative for dizziness, tremors, seizures, syncope, facial asymmetry, speech difficulty, weakness, light-headedness and numbness.  ?Psychiatric/Behavioral:  Negative for confusion.   ? ?Physical Exam ?Updated Vital Signs ?BP 124/79 (BP Location: Right Arm)   Pulse 82   Temp 98.5 ?F (36.9 ?C) (Oral)   Resp 16   Ht 5\' 4"  (1.626 m)   Wt 56.7 kg   SpO2 100%   BMI 21.46 kg/m?  ?Physical Exam ?Vitals and nursing note reviewed.  ?Constitutional:   ?   General: She is not in acute distress. ?   Appearance: She is not ill-appearing, toxic-appearing or diaphoretic.  ?HENT:  ?   Nose:  ?   Right Nostril: No foreign body, epistaxis, septal hematoma or occlusion.  ?   Left Nostril: No foreign body, epistaxis, septal hematoma or occlusion.  ?   Right Turbinates: Not enlarged, swollen or pale.  ?  Left Turbinates: Not enlarged, swollen or pale.  ?   Comments: Minimal amount of dried blood in right nare ?   Mouth/Throat:  ?   Lips: Pink. No lesions.  ?   Mouth: Mucous membranes are moist.  ?   Dentition: No gingival swelling or dental abscesses.  ?   Tongue: No lesions. Tongue does not deviate from midline.  ?   Palate: No mass and lesions.  ?   Pharynx: Oropharynx is clear. Uvula midline. No pharyngeal swelling, oropharyngeal exudate, posterior oropharyngeal erythema or uvula swelling.  ?   Tonsils: No tonsillar abscesses. 0 on the right. 0 on  the left.  ?Eyes:  ?   General:     ?   Right eye: No discharge.     ?   Left eye: No discharge.  ?   Extraocular Movements: Extraocular movements intact.  ?   Conjunctiva/sclera: Conjunctivae normal.  ?   Pupils: Pupils are equal, round, and reactive to light.  ?Cardiovascular:  ?   Rate and Rhythm: Normal rate.  ?Pulmonary:  ?   Effort: Pulmonary effort is normal.  ?Musculoskeletal:  ?   Cervical back: Normal range of motion and neck supple. No rigidity.  ?Skin: ?   General: Skin is warm and dry.  ?Neurological:  ?   General: No focal deficit present.  ?   Mental Status: She is alert and oriented to person, place, and time.  ?   GCS: GCS eye subscore is 4. GCS verbal subscore is 5. GCS motor subscore is 6.  ?   Cranial Nerves: No cranial nerve deficit or facial asymmetry.  ?   Sensory: Sensation is intact.  ?   Motor: No weakness, tremor, seizure activity or pronator drift.  ?   Coordination: Romberg sign negative. Finger-Nose-Finger Test normal.  ?   Gait: Gait is intact. Gait normal.  ?   Comments: CN II-XII intact, equal grip strength, +5 strength to bilateral upper and lower extremities  ? ?  ?Psychiatric:     ?   Behavior: Behavior is cooperative.  ? ? ?ED Results / Procedures / Treatments   ?Labs ?(all labs ordered are listed, but only abnormal results are displayed) ?Labs Reviewed - No data to display ? ?EKG ?None ? ?Radiology ?No results found. ? ?Procedures ?Procedures  ? ? ?Medications Ordered in ED ?Medications - No data to display ? ?ED Course/ Medical Decision Making/ A&P ?  ?                        ?Medical Decision Making ? ?Alert [age] year old female no acute distress, nontoxic-appearing.  Presents emerged part with a chief complaint of epistaxis and headache. ? ?Information obtained from patient.  Past medical records were reviewed including previous divider notes, labs, and imaging.  Patient has medical history as outlined in HPI which complicates her care. ? ?Headache onset was slow and  progressively worsening.  Neuro exam is reassuring.  No recent head trauma.  Low suspicion for acute intracranial abnormality at this time. ? ?Epistaxis stop spontaneously on its own.  Patient is on any blood thinners.  Discussed symptomatic epistaxis treatment.  Patient advised to use Flonase over the next 3 days and to avoid excessive rubbing or touching of her nose. ? ?Patient to follow-up with PCP in outpatient setting. ? ?Based on patient's chief complaint, I considered admission might be necessary, however after reassuring ED workup feel patient is reasonable for discharge.  Discussed results, findings, treatment and follow up. Patient advised of return precautions. Patient verbalized understanding and agreed with plan. ? ?Portions of this note were generated with Scientist, clinical (histocompatibility and immunogenetics). Dictation errors may occur despite best attempts at proofreading. ? ? ? ? ? ? ? ? ?Final Clinical Impression(s) / ED Diagnoses ?Final diagnoses:  ?None  ? ? ?Rx / DC Orders ?ED Discharge Orders   ? ? None  ? ?  ? ? ?  ?Haskel Schroeder, PA-C ?10/30/21 1317 ? ?  ?Virgina Norfolk, DO ?10/30/21 1329 ? ?

## 2021-10-30 NOTE — ED Triage Notes (Signed)
Reports has been having nosebleeds since Sunday and now having bad headaches.  No blood thinners or BP issues. Nose is currently not bleeding in triage ?

## 2021-11-27 IMAGING — CT CT ABD-PELV W/ CM
2 of 4 series · 16 of 46 positions shown, 18 images · IV contrast (APPLIED)
Comparison: None.

CLINICAL DATA: Generalized abdominal pain

EXAM:
CT ABDOMEN AND PELVIS WITH CONTRAST
TECHNIQUE: Multidetector CT imaging of the abdomen and pelvis was performed
using the standard protocol following bolus administration of
intravenous contrast.
CONTRAST:  100mL OMNIPAQUE IOHEXOL 300 MG/ML  SOLN

[Series 3: abdomen 5.0 · axial · 0.68mm/px · z∈[-568,-213]mm · 13 of 81 slices shown, 15 images]
[im 5/81  soft-tissue]
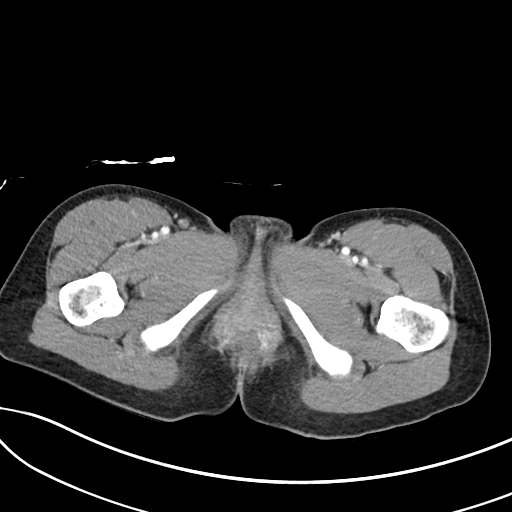
[im 5/81  bone]
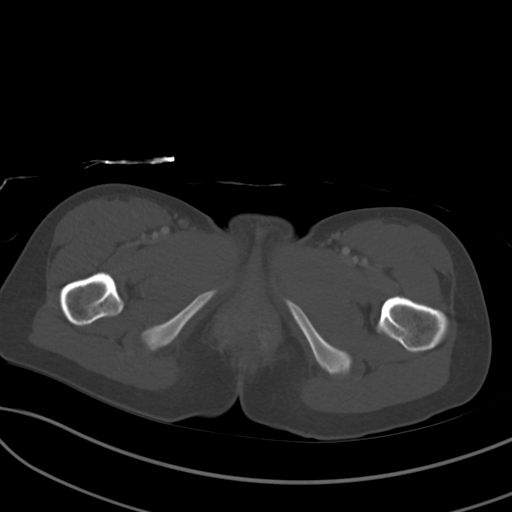
[im 9/81  soft-tissue]
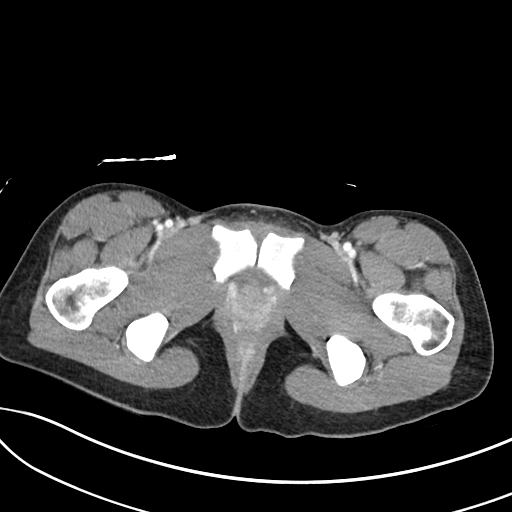
[im 18/81  soft-tissue]
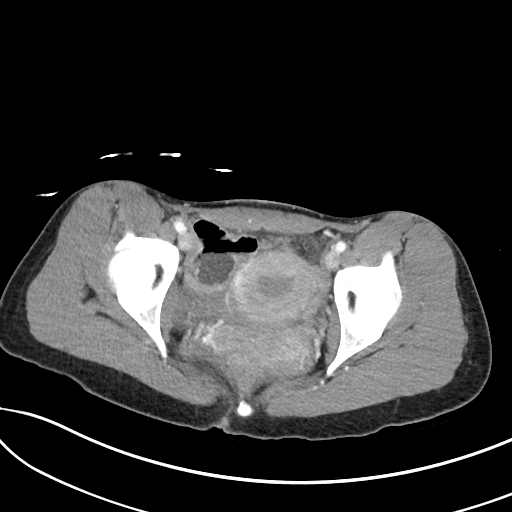
[im 23/81  soft-tissue]
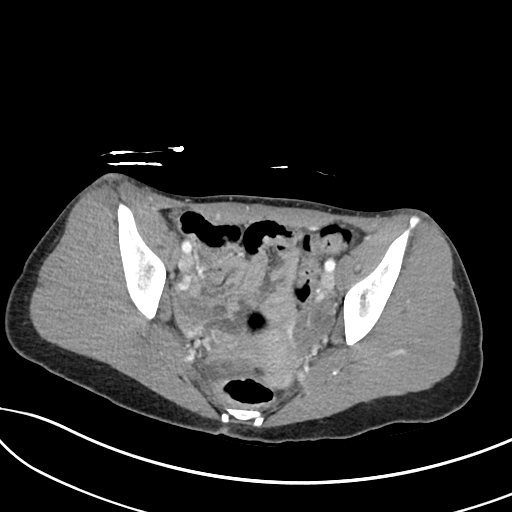
[im 27/81  soft-tissue]
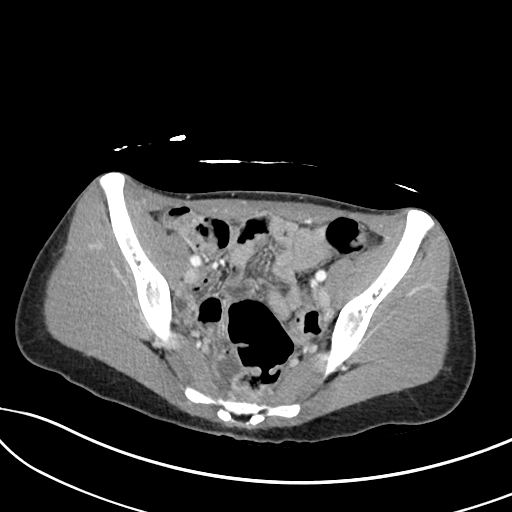
[im 36/81  soft-tissue]
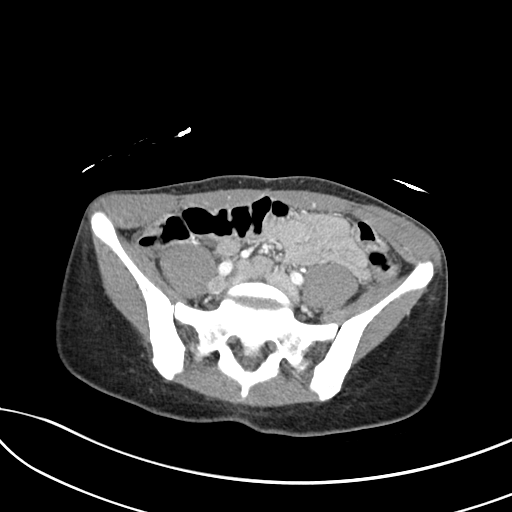
[im 41/81  soft-tissue]
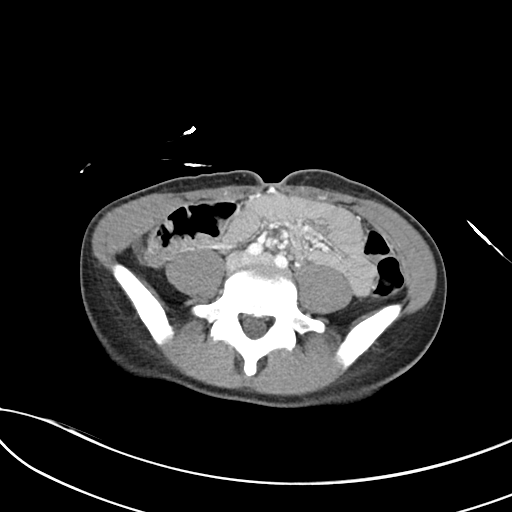
[im 45/81  soft-tissue]
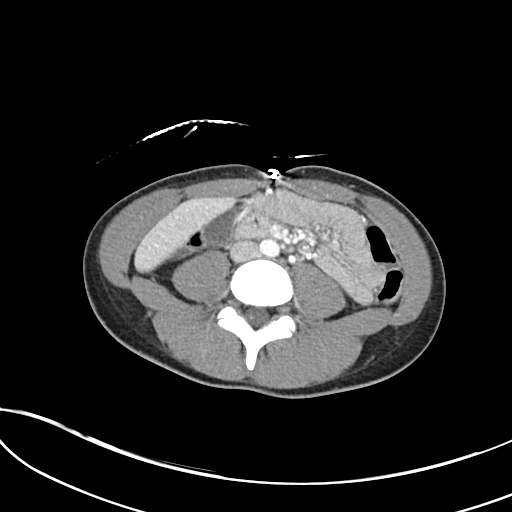
[im 54/81  soft-tissue]
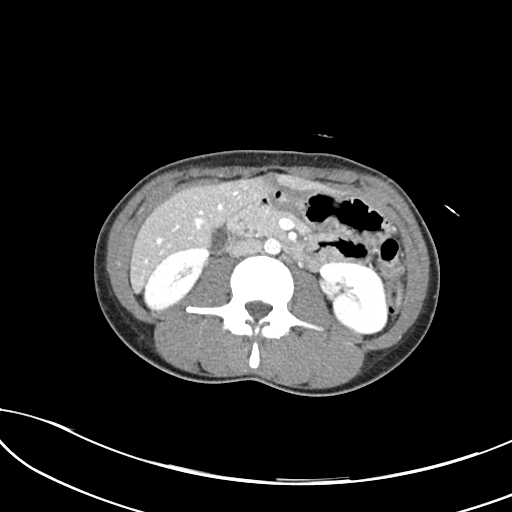
[im 54/81  bone]
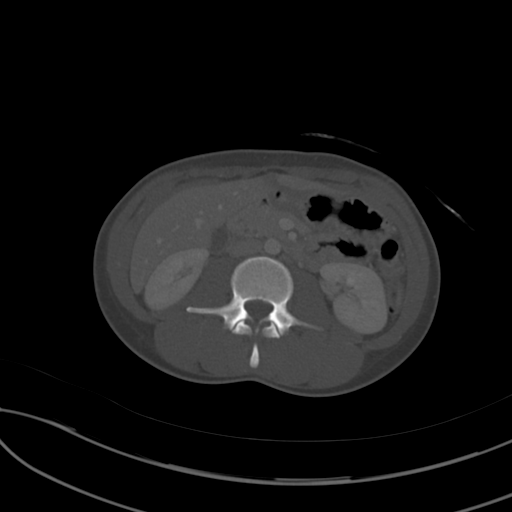
[im 58/81  soft-tissue]
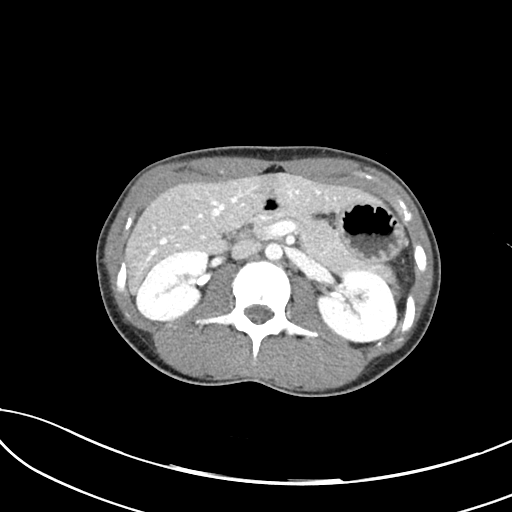
[im 63/81  soft-tissue]
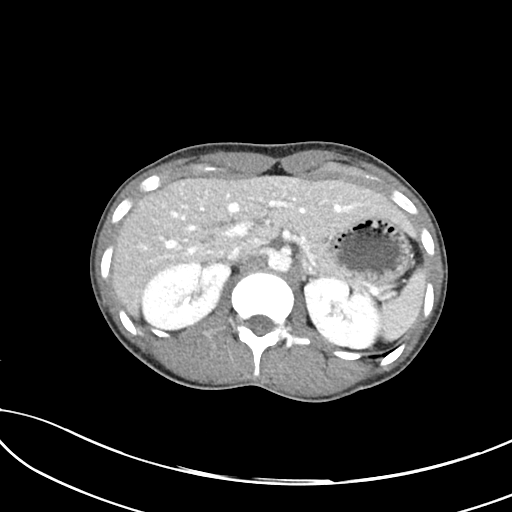
[im 72/81  soft-tissue]
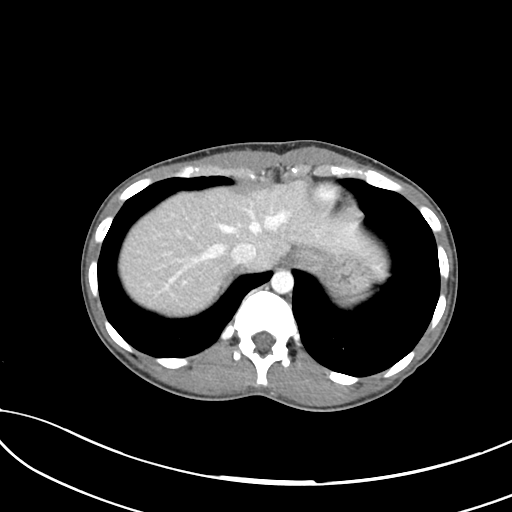
[im 76/81  soft-tissue]
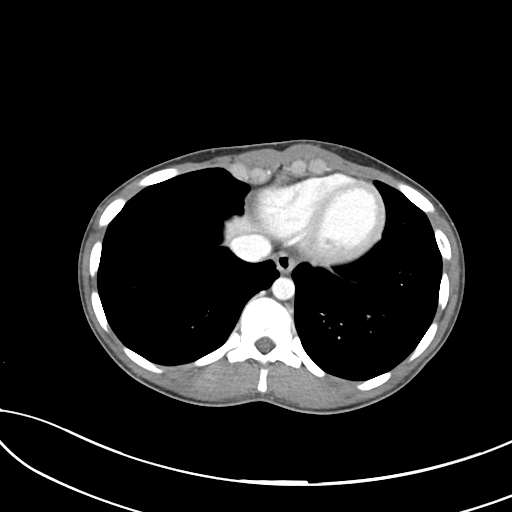

[Series 6: abdomen 3.0 mpr cor · coronal · 0.57mm/px · 3 of 73 slices shown]
[im 25/73  soft-tissue]
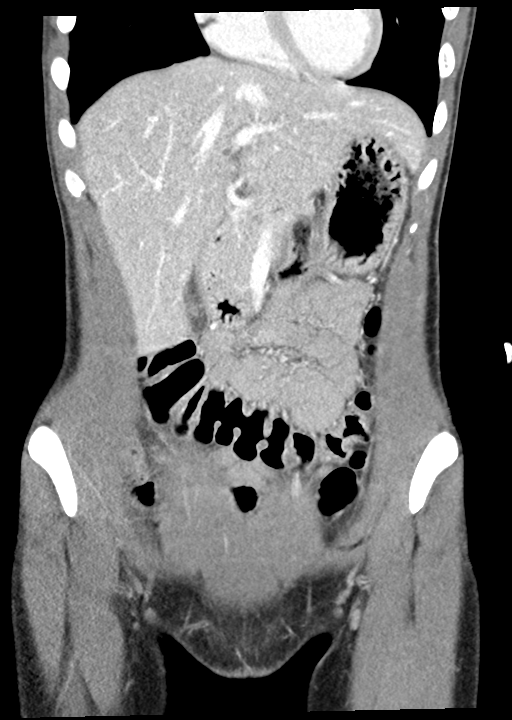
[im 33/73  soft-tissue]
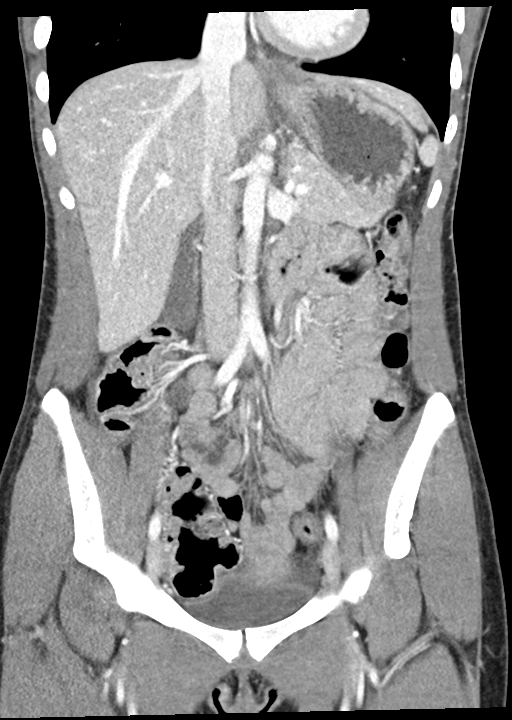
[im 41/73  soft-tissue]
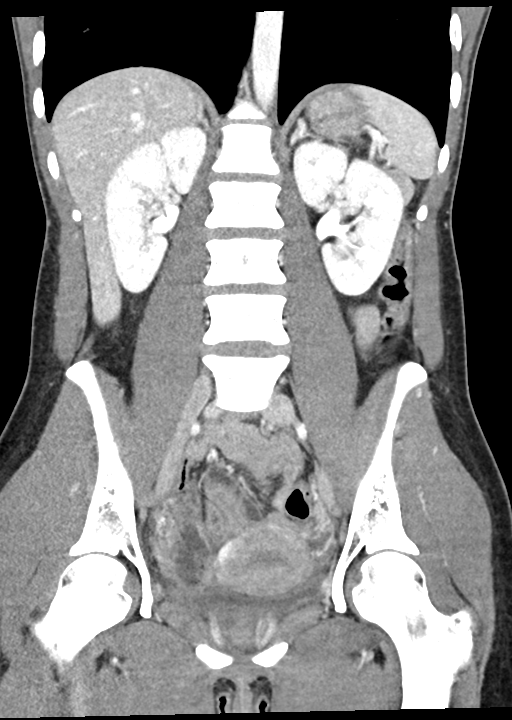

[16 of 46 positions shown; findings below may reference images not displayed]

FINDINGS: Lower chest: No acute abnormality.

Hepatobiliary: No focal liver abnormality is seen. No gallstones,
gallbladder wall thickening, or biliary dilatation.

Pancreas: Unremarkable. No pancreatic ductal dilatation or
surrounding inflammatory changes.

Spleen: Normal in size without focal abnormality.

Adrenals/Urinary Tract: Adrenal glands are within normal limits
bilaterally. Kidneys demonstrate normal enhancement pattern with
normal excretion of contrast material bilaterally. The bladder is
decompressed.

Stomach/Bowel: The appendix is well visualized and partially
air-filled. No significant inflammatory changes are noted. Small
bowel and stomach appear within normal limits.

Vascular/Lymphatic: No significant vascular findings are present. No
enlarged abdominal or pelvic lymph nodes.

Reproductive: Dominant right ovarian cyst is noted measuring 2 cm in
greatest dimension. The ovaries demonstrate follicular changes
bilaterally. Uterus appears within normal limits.

Other: No abdominal wall hernia or abnormality. No abdominopelvic
ascites.

Musculoskeletal: No acute or significant osseous findings.
IMPRESSION: Normal-appearing appendix.

2 cm right ovarian cyst without complicating factors.

No other focal abnormality is noted.

## 2022-01-26 IMAGING — US US OB < 14 WEEKS - US OB TV
1 series · 15 of 28 positions shown · non-contrast
Comparison: None

CLINICAL DATA: Pelvic cramping, first trimester pregnancy

EXAM:
OBSTETRIC <14 WK US AND TRANSVAGINAL OB US
TECHNIQUE: Both transabdominal and transvaginal ultrasound examinations were
performed for complete evaluation of the gestation as well as the
maternal uterus, adnexal regions, and pelvic cul-de-sac.
Transvaginal technique was performed to assess early pregnancy.

[Series 1: us ob < 14 weeks - us ob tv · 51 acquisitions, 15 frames shown]
[im 1/51]
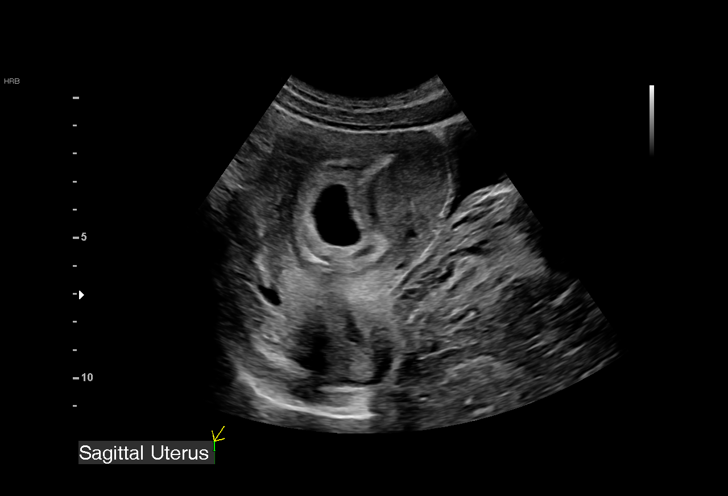
[im 4/51]
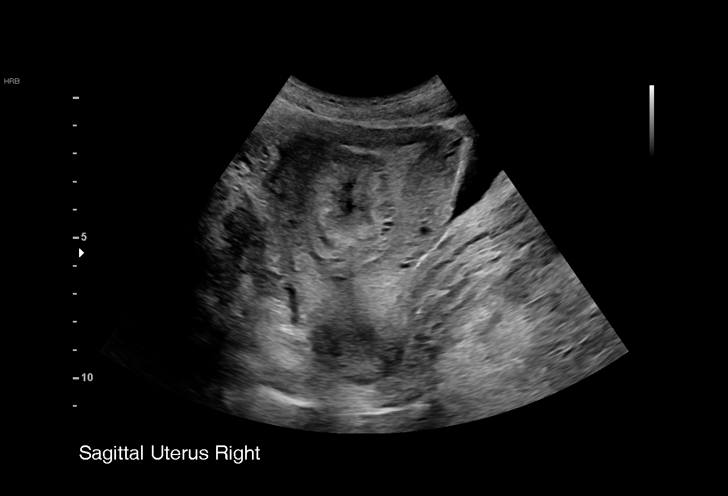
[im 8/51]
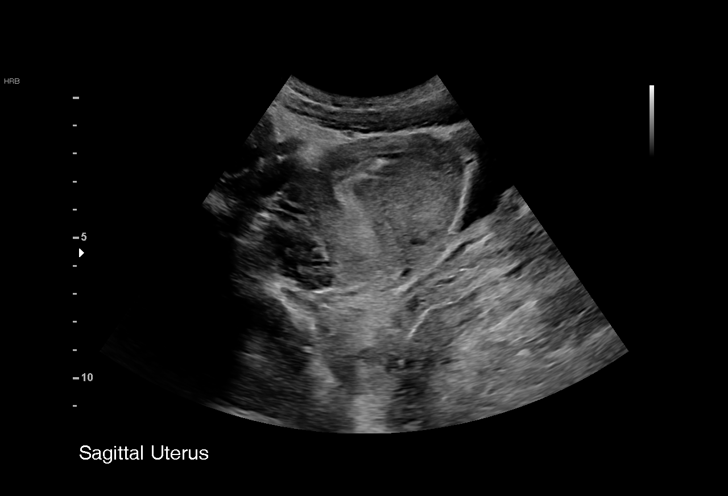
[im 12/51]
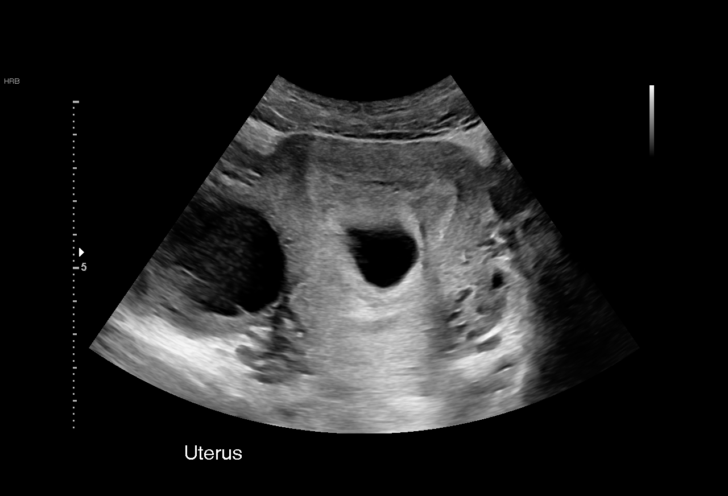
[im 15/51]
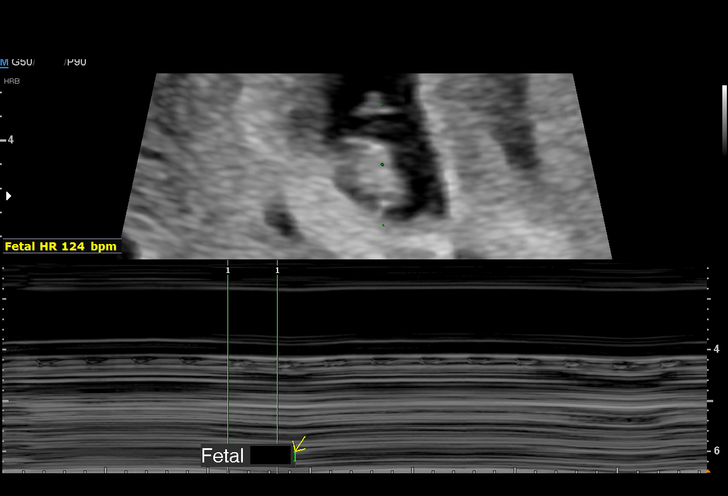
[im 19/51]
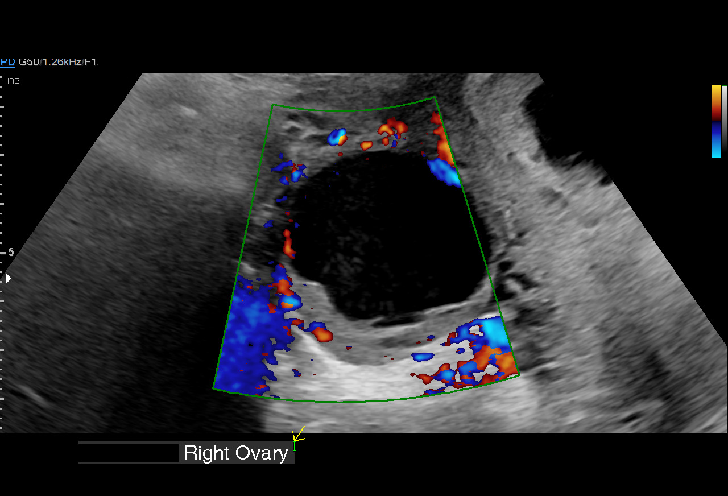
[im 23/51]
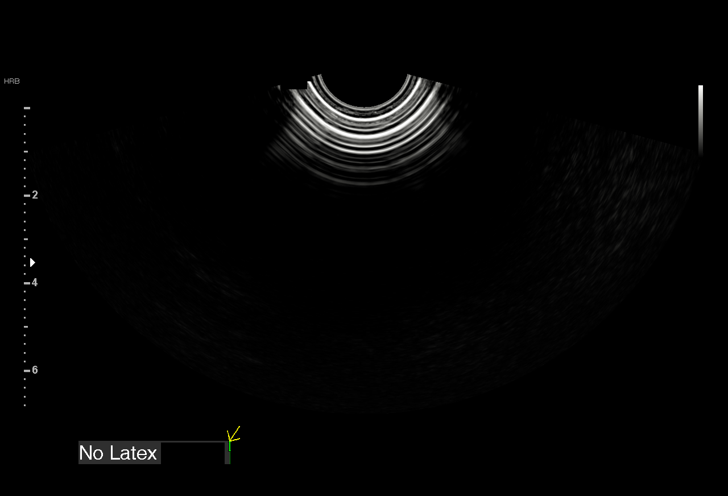
[im 26/51]
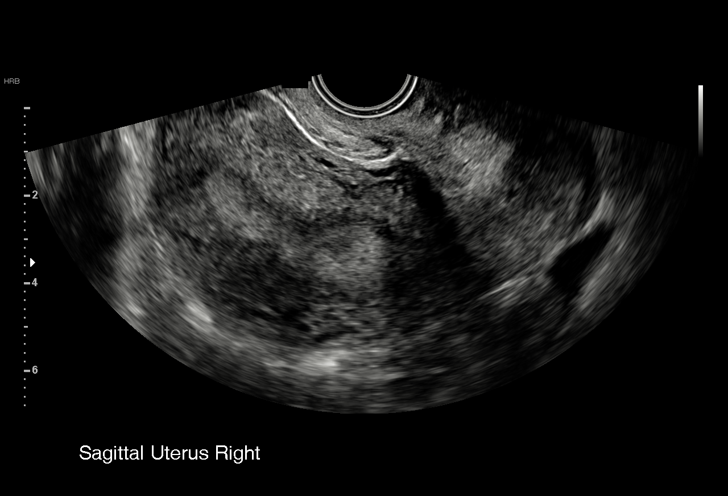
[im 28/51]
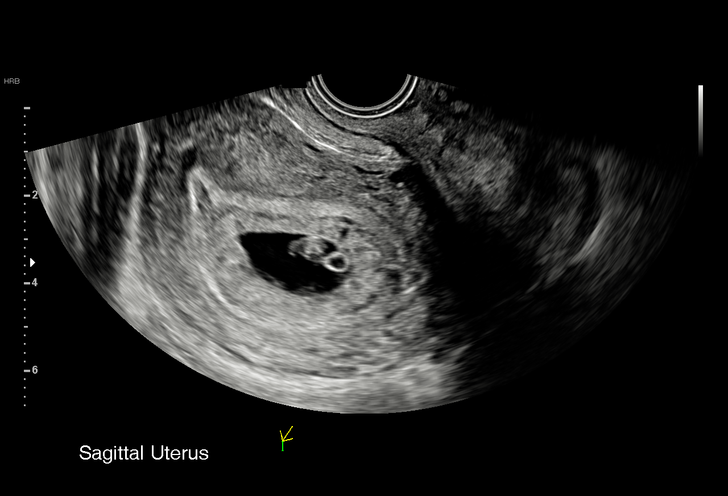
[im 32/51]
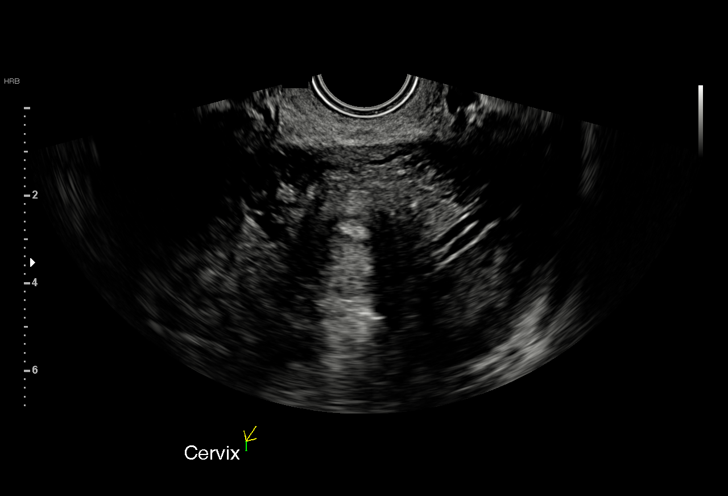
[im 36/51]
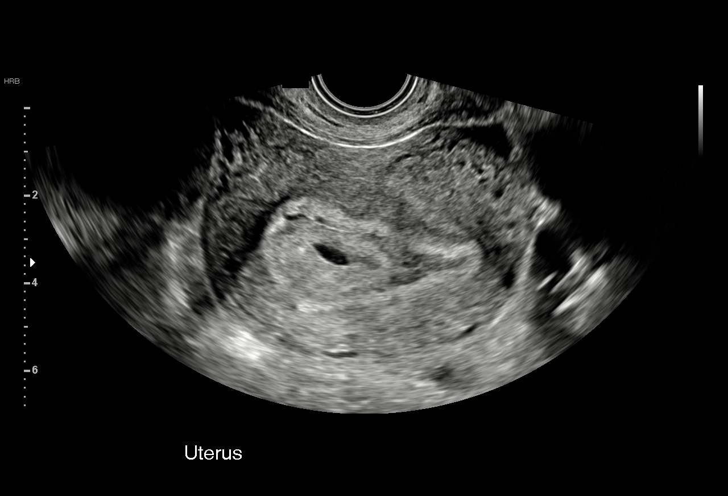
[im 39/51]
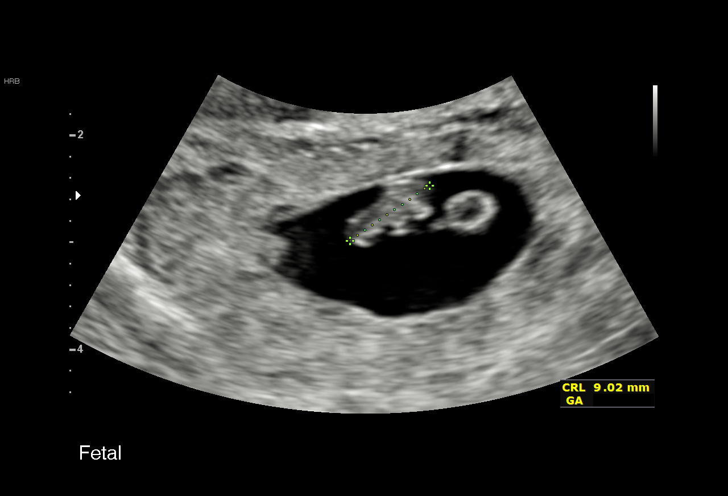
[im 43/51]
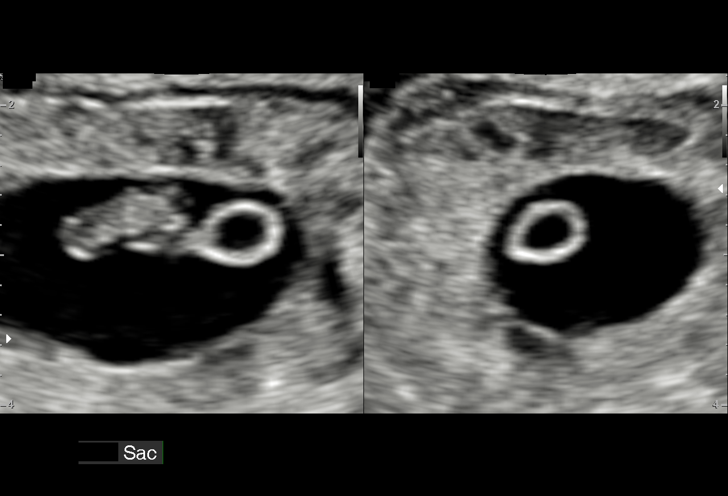
[im 47/51]
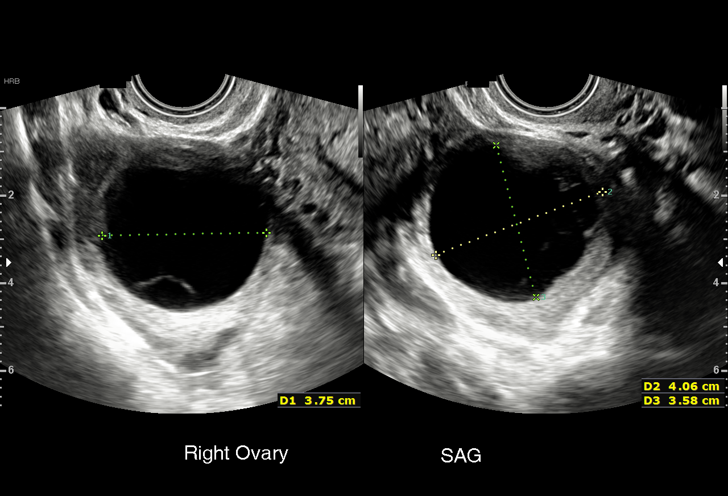
[im 51/51]
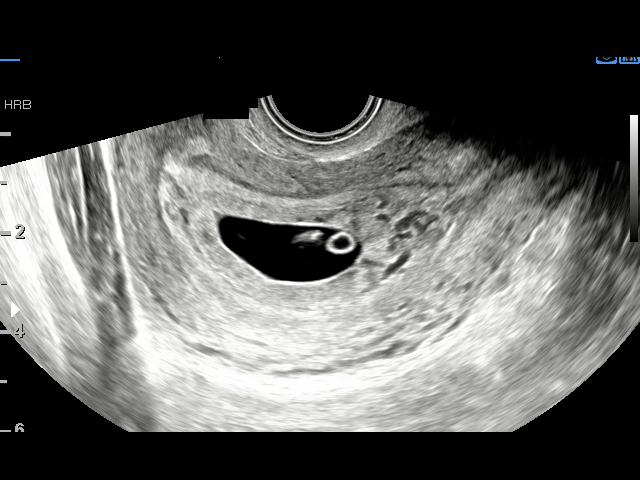

[15 of 28 positions shown; findings below may reference images not displayed]

FINDINGS: Intrauterine gestational sac: Present, single

Yolk sac:  Present

Embryo:  Present

Cardiac Activity: Present

Heart Rate: 130 bpm

CRL:  9.1 mm   6 w   6 d                  US EDC: 03/31/2020

Subchorionic hemorrhage:  None visualized.

Maternal uterus/adnexae:

RIGHT ovary measures 4.6 x 5.6 x 4.2 cm and contains a cyst 4.1 x
3.6 x 3.8 cm in size which contains a single thin septation.

LEFT ovary normal size and morphology 3.7 x 1.7 x 2.0 cm.

No free pelvic fluid or adnexal masses.
IMPRESSION: Single live intrauterine gestation at 6 weeks 6 days EGA by
crown-rump length.

Minimally complicated cyst RIGHT ovary 4.1 cm diameter.

## 2024-03-31 ENCOUNTER — Ambulatory Visit: Admission: EM | Admit: 2024-03-31 | Discharge: 2024-03-31 | Disposition: A | Discharge status: Home / Self Care

## 2024-03-31 ENCOUNTER — Encounter: Payer: Self-pay | Admitting: Emergency Medicine

## 2024-03-31 DIAGNOSIS — R42 Dizziness and giddiness: Secondary | ICD-10-CM | POA: Diagnosis not present

## 2024-03-31 LAB — POCT URINE DIPSTICK
Bilirubin, UA: NEGATIVE
Glucose, UA: NEGATIVE mg/dL
Ketones, POC UA: NEGATIVE mg/dL
Leukocytes, UA: NEGATIVE
Nitrite, UA: NEGATIVE
Protein Ur, POC: NEGATIVE mg/dL
Spec Grav, UA: 1.02 (ref 1.010–1.025)
Urobilinogen, UA: 0.2 U/dL
pH, UA: 7 (ref 5.0–8.0)

## 2024-03-31 LAB — POCT URINE PREGNANCY: Preg Test, Ur: NEGATIVE

## 2024-03-31 LAB — GLUCOSE, POCT (MANUAL RESULT ENTRY): POCT Glucose (KUC): 89 mg/dL (ref 70–99)

## 2024-03-31 NOTE — ED Provider Notes (Signed)
 [facility]    CSN: [id number] Arrival date & time: 03/31/24  1736      History   Chief Complaint Chief Complaint  Patient presents with   Dizziness    HPI Taylor Golden is a 28 y.o. female.   Discussed the use of AI scribe software for clinical note transcription with the patient, who gave verbal consent to proceed.   The patient presents with dizziness and associated symptoms that occurred while at work. The patient experienced an episode where everything rushed to her head and felt dizzy, though she did not lose consciousness. During the episode, she experienced chest tightness, blurred vision, trembling, and what she described as panicking. She also noted tingling within the neck area. The patient has not eaten recently but consumed fries for lunch and two tacos earlier in the day before going to work.  Currently, the patient continues to experience headache and chest tightness. She reports having a headache now, though she did not have one before the episode occurred. She denies nausea, vomiting, palpitations, numbness or tingling in face/hands/feet (other than the neck tingling mentioned), and speech problems during the event.  The patient has a history of a previous similar episode where she passed out due to low blood sugar while at work, requiring ambulance transport, though this occurred a while ago. She works in an [employer]. She reports feeling a little sick before going to work today and mentions drinking a lot of alcohol this week, wondering if dehydration might be contributing to her symptoms. Her last menstrual period was approximately three weeks ago and she is on birth control. She denies recent illness, cough, or cold symptoms.  The following sections of the patient's history were reviewed and updated as appropriate: allergies, current medications, past family history, past medical history, past social history, past surgical history, and  problem list.      Past Medical History:  Diagnosis Date   Acne 2007   Medical history non-contributory    Seasonal allergies     Patient Active Problem List   Diagnosis Date Noted   Dysmenorrhea 02/13/2013   Candidiasis of vagina 02/13/2013   Bacterial vaginosis 02/13/2013   Oral contraceptive prescribed 02/13/2013   Exposure to potentially hazardous body fluids 02/13/2013    Past Surgical History:  Procedure Laterality Date   FOOT SURGERY Right 2012   Cyst Removal   TONSILECTOMY, ADENOIDECTOMY, BILATERAL MYRINGOTOMY AND TUBES N/A 2004   TONSILLECTOMY      OB History     Gravida  1   Para      Term      Preterm      AB      Living         SAB      IAB      Ectopic      Multiple      Live Births               Home Medications    Prior to Admission medications   Medication Sig Start Date End Date Taking? Authorizing Provider  clobetasol ointment (TEMOVATE) 0.05 % Use bid to affected areas x 10 days, then prn. 04/17/22  Yes [provider]  fluconazole  (DIFLUCAN ) 150 MG tablet Take 150 mg by mouth daily. 12/22/23  Yes [provider]  methylPREDNISolone (MEDROL DOSEPAK) 4 MG TBPK tablet See admin instructions. see package 12/22/23  Yes [provider]  norethindrone-ethinyl estradiol -FE (BLISOVI FE 1/20) 1-20 MG-MCG tablet Take 1  tablet by mouth daily. 03/21/24  Yes [provider]  norethindrone-ethinyl estradiol -FE (LOESTRIN FE) 1-20 MG-MCG tablet Take 1 tablet by mouth daily. 12/05/21  Yes [provider]  Norgestimate-Ethinyl Estradiol  Triphasic 0.18/0.215/0.25 MG-35 MCG tablet Take 1 tablet by mouth daily. 09/30/16  Yes [provider]  valACYclovir (VALTREX) 500 MG tablet valacyclovir 500 mg tablet 06/21/15  Yes [provider]  clindamycin (CLEOCIN T) 1 % external solution clindamycin phosphate 1 % topical solution    [provider]  famotidine  (PEPCID ) 20 MG tablet Take 1  tablet (20 mg total) by mouth daily. 08/12/19 08/11/20  Nugent, Nat BRAVO, NP  Ferrous Sulfate  (IRON PO) Take by mouth.    [provider]  ferrous sulfate  325 (65 FE) MG tablet Take 1 tablet (325 mg total) by mouth daily. 08/12/19 08/11/20  Nugent, Nat BRAVO, NP  metroNIDAZOLE  (FLAGYL ) 500 MG tablet metronidazole  500 mg tablet    [provider]  ondansetron  (ZOFRAN -ODT) 8 MG disintegrating tablet ondansetron  8 mg disintegrating tablet    [provider]  promethazine  (PHENERGAN ) 12.5 MG tablet Take 1 tablet (12.5 mg total) by mouth every 6 (six) hours as needed for nausea or vomiting. 08/12/19   Nugent, Nat BRAVO, NP  promethazine  (PHENERGAN ) 25 MG/ML injection promethazine  25 mg/mL injection solution  Take 25 mg every day by injection route for 1 day.    [provider]  valACYclovir (VALTREX) 1000 MG tablet valacyclovir 1 gram tablet    [provider]    Family History Family History  Problem Relation Age of Onset   Hypertension Mother    Hypertension Maternal Grandmother     Social History Social History   Tobacco Use   Smoking status: Never    Passive exposure: Never   Smokeless tobacco: Never  Vaping Use   Vaping status: Former  Substance Use Topics   Alcohol use: Not Currently   Drug use: Not Currently    Types: Marijuana    Comment: last smoked 08/10/2019     Allergies   Peanut oil   Review of Systems Review of Systems  HENT: Negative.    Eyes:  Positive for visual disturbance.  Respiratory:  Positive for chest tightness. Negative for shortness of breath.   Cardiovascular:  Positive for chest pain. Negative for palpitations and leg swelling.  Gastrointestinal:  Negative for nausea and vomiting.  Genitourinary:  Negative for menstrual problem (LMP about 3 weeks ago).  Musculoskeletal:  Negative for joint swelling.  Neurological:  Positive for dizziness and headaches. Negative for syncope and speech difficulty.  All other systems  reviewed and are negative.    Physical Exam Triage Vital Signs ED Triage Vitals  Encounter Vitals Group     BP 03/31/24 1840 (!) 145/92     Girls Systolic BP Percentile --      Girls Diastolic BP Percentile --      Boys Systolic BP Percentile --      Boys Diastolic BP Percentile --      Pulse Rate 03/31/24 1840 92     Resp 03/31/24 1840 18     Temp 03/31/24 1840 99.1 F (37.3 C)     Temp Source 03/31/24 1840 Oral     SpO2 03/31/24 1840 97 %     Weight 03/31/24 1839 135 lb (61.2 kg)     Height --      Head Circumference --      Peak Flow --      Pain Score 03/31/24  1838 7     Pain Loc --      Pain Education --      Exclude from Growth Chart --    No data found.  Updated Vital Signs BP (!) 145/92 (BP Location: Left Arm)   Pulse 92   Temp 99.1 F (37.3 C) (Oral)   Resp 18   Wt 135 lb (61.2 kg)   LMP 03/14/2024 (Approximate)   SpO2 97%   Breastfeeding No   BMI 23.17 kg/m   Visual Acuity Right Eye Distance:   Left Eye Distance:   Bilateral Distance:    Right Eye Near:   Left Eye Near:    Bilateral Near:     Physical Exam Vitals reviewed.  Constitutional:      General: She is awake. She is not in acute distress.    Appearance: Normal appearance. She is well-developed. She is not ill-appearing, toxic-appearing or diaphoretic.  HENT:     Head: Normocephalic.     Nose: Nose normal.     Mouth/Throat:     Mouth: Mucous membranes are moist.  Eyes:     General: Vision grossly intact.     Extraocular Movements: Extraocular movements intact.     Right eye: Normal extraocular motion and no nystagmus.     Left eye: Normal extraocular motion and no nystagmus.     Conjunctiva/sclera: Conjunctivae normal.     Pupils: Pupils are equal, round, and reactive to light.  Neck:     Trachea: Trachea normal.  Cardiovascular:     Rate and Rhythm: Normal rate.     Heart sounds: Normal heart sounds.  Pulmonary:     Effort: Pulmonary effort is normal.     Breath sounds:  Normal breath sounds and air entry.  Abdominal:     Palpations: Abdomen is soft.  Musculoskeletal:        General: Normal range of motion.     Cervical back: Full passive range of motion without pain, normal range of motion and neck supple. No rigidity or tenderness.  Skin:    General: Skin is warm and dry.  Neurological:     General: No focal deficit present.     Mental Status: She is alert and oriented to person, place, and time.     Cranial Nerves: No cranial nerve deficit.     Sensory: Sensation is intact. No sensory deficit.     Motor: Motor function is intact. No weakness.     Coordination: Coordination is intact.     Gait: Gait is intact.  Psychiatric:        Behavior: Behavior is cooperative.      UC Treatments / Results  Labs (all labs ordered are listed, but only abnormal results are displayed) Labs Reviewed  POCT URINE DIPSTICK - Abnormal; Notable for the following components:      Result Value   Color, UA light yellow (*)    Blood, UA trace-intact (*)    All other components within normal limits  GLUCOSE, POCT (MANUAL RESULT ENTRY) - Normal  POCT URINE PREGNANCY - Normal    EKG   Radiology No results found.  Procedures Procedures (including critical care time)  Medications Ordered in UC Medications - No data to display  Initial Impression / Assessment and Plan / UC Course  I have reviewed the triage vital signs and the nursing notes.  Pertinent labs & imaging results that were available during my care of the patient were reviewed by me and considered  in my medical decision making (see chart for details).     The patient presented with acute dizziness at work accompanied by headache, chest tightness, blurred vision, and tingling in the inner neck. She has a history of a syncopal episode related to hypoglycemia, though her blood sugar is normal today. Examination revealed stable vital signs, normal heart and lung sounds, and no focal neurological  deficits. Urinalysis was unremarkable without signs of infection or dehydration, and urine pregnancy test was negative. Symptoms are most consistent with possible dehydration in the setting of recent alcohol use and poor oral intake, though other causes of dizziness cannot be fully excluded. The patient is feeling improved and is stable for discharge. She was advised to increase fluid intake, eat small frequent meals, avoid excessive alcohol use, and monitor symptoms closely. Emergency department precautions were reviewed, and follow-up with primary care was recommended for ongoing evaluation.  Today's evaluation has revealed no signs of a dangerous process. Discussed diagnosis with patient and/or guardian. Patient and/or guardian aware of their diagnosis, possible red flag symptoms to watch out for and need for close follow up. Patient and/or guardian understands verbal and written discharge instructions. Patient and/or guardian comfortable with plan and disposition.  Patient and/or guardian has a clear mental status at this time, good insight into illness (after discussion and teaching) and has clear judgment to make decisions regarding their care  Documentation was completed with the aid of voice recognition software. Transcription may contain typographical errors.   Final Clinical Impressions(s) / UC Diagnoses   Final diagnoses:  Dizziness     Discharge Instructions      You were seen today for dizziness that started while you were at work. You also experienced headache, chest tightness, blurred vision, and some tingling in your neck. Your blood sugar was normal, your heart and lungs sounded normal, your urine test did not show infection or dehydration, and your pregnancy test was negative. Your vital signs were stable, and you did not have any signs of a serious neurological problem. Your symptoms may be related to dehydration, especially since you reported recent alcohol use and not eating  much. At home, it is important to drink plenty of fluids, eat small frequent meals, and avoid alcohol until you are feeling better. Getting enough rest and maintaining regular meals will also help reduce dizziness. Monitor your symptoms closely over the next few days. Follow up with your primary care provider soon for continued evaluation, especially if you continue to have dizziness, headaches, or vision changes. Go to the emergency department right away if you develop severe or worsening dizziness, chest pain, trouble breathing, fainting, sudden weakness or numbness, slurred speech, severe headache, or if your symptoms worsen in any way.     ED Prescriptions   None    PDMP not reviewed this encounter.   Iola Lukes, [place] 03/31/24 1946

## 2024-03-31 NOTE — Discharge Instructions (Addendum)
 You were seen today for dizziness that started while you were at work. You also experienced headache, chest tightness, blurred vision, and some tingling in your neck. Your blood sugar was normal, your heart and lungs sounded normal, your urine test did not show infection or dehydration, and your pregnancy test was negative. Your vital signs were stable, and you did not have any signs of a serious neurological problem. Your symptoms may be related to dehydration, especially since you reported recent alcohol use and not eating much. At home, it is important to drink plenty of fluids, eat small frequent meals, and avoid alcohol until you are feeling better. Getting enough rest and maintaining regular meals will also help reduce dizziness. Monitor your symptoms closely over the next few days. Follow up with your primary care provider soon for continued evaluation, especially if you continue to have dizziness, headaches, or vision changes. Go to the emergency department right away if you develop severe or worsening dizziness, chest pain, trouble breathing, fainting, sudden weakness or numbness, slurred speech, severe headache, or if your symptoms worsen in any way.

## 2024-03-31 NOTE — ED Triage Notes (Signed)
 Pt states, when I was at work I just all of a sudden got dizzy and felt like as rush to my head. I have passed out previously and it kind of feels the same as before. It was because my sugar had dropped.   Pt has not eaten anything since leaving work. Only drank water.
